# Patient Record
Sex: Female | Born: 1990 | Race: White | Hispanic: No | Marital: Married | Smoking: Never smoker
Health system: Southern US, Community
[De-identification: ages and names within clinical notes are randomized; demographics above are authoritative.]

## PROBLEM LIST (undated history)

## (undated) ENCOUNTER — Inpatient Hospital Stay (HOSPITAL_COMMUNITY): Payer: Self-pay

## (undated) DIAGNOSIS — N189 Chronic kidney disease, unspecified: Secondary | ICD-10-CM

## (undated) HISTORY — DX: Chronic kidney disease, unspecified: N18.9

---

## 2013-05-20 ENCOUNTER — Encounter: Payer: Self-pay | Admitting: Obstetrics & Gynecology

## 2013-05-20 ENCOUNTER — Ambulatory Visit (INDEPENDENT_AMBULATORY_CARE_PROVIDER_SITE_OTHER): Admitting: Obstetrics & Gynecology

## 2013-05-20 VITALS — BP 106/73 | Temp 98.3°F | Ht 64.0 in | Wt 117.4 lb

## 2013-05-20 DIAGNOSIS — Z3201 Encounter for pregnancy test, result positive: Secondary | ICD-10-CM

## 2013-05-20 DIAGNOSIS — Z3402 Encounter for supervision of normal first pregnancy, second trimester: Secondary | ICD-10-CM

## 2013-05-20 LAB — POCT URINALYSIS DIPSTICK
Blood, UA: NEGATIVE
Glucose, UA: NEGATIVE
Ketones, UA: NEGATIVE
Nitrite, UA: NEGATIVE
Spec Grav, UA: 1.02

## 2013-05-20 NOTE — Patient Instructions (Signed)
Exercise During Pregnancy It is possible to maintain a healthy exercise program throughout a pregnancy. However, it is important to discuss exercising with your caregiver, so that the two of you may develop an appropriate exercise program. It is important to remember that exercise during pregnancy should be use to maintain one's health and not to lose weight. Strenuous activities should be avoided as the may cause the baby to have difficulty obtaining proper amounts of oxygen. A proper pregnancy exercise plan has many benefits including preparing you for the physical challenges of childbirth by strengthening the muscles that help with childbirth, reducing common backaches, alleviating constipation, improving posture, elevating mood, and promoting better sleep. If possible, begin exercising regularly before you become pregnant and continue through the duration of the pregnancy. It is difficult for a woman to begin an exercise program later in a pregnancy due to enlargement of the uterus and breasts and a shift in the center of gravity. Pregnancy exercise programs should be aimed at improving the muscles of the heart, back, pelvis, and abdomen. GENERAL GUIDELINES  Every woman and pregnancy is different; thus, the level of exercise you can do depends on your health, the conditions of the pregnancy, and activity level before pregnancy. For women who were sedentary before pregnancy, walking is a good way to begin. Use caution while participation in sports during pregnancy, because your center of gravity changes and may affect your balance or that require rapid movements. Always make sure to drink plenty of fluids to avoid dehydration, which may decrease blood flow to your baby. Avoid any activity that has the potential for trauma to the abdomen. If possible try to avoid high-altitude activities, which can deprive you and your baby of oxygen; this may cause premature labor. Talk with your caregiver.  Performing a  proper warm up and cool down are very important. It is important to start slowly and build up to more demanding exercises. Toward the end of an exercise session, gradually slow your activity. Perhaps try and work back through the exercises in reverse order. Check your pulse during peak activity, and discuss with your caregiver an appropriate range of heart rate for activity. Slow down your activity if your heart starts beating faster than the target range recommended by your health care provider. Do not exceed a heart rate of 140 beats per minute. Exercise that is too strenuous may speed up the baby's heartbeat to a dangerous level. In general, if you are able to carry on a conversation comfortably while exercising, your heart rate is probably within the recommended limits. Check to make sure.  You should stop exercising and call your health care provider if you have any unusual symptoms, such as pain, uterine contractions, chest pain, bleeding or fluid leakage from the vagina, dizziness, or shortness of breath. Talk to your health caregiver if you have any questions.  Document Released: 10/24/2005 Document Revised: 01/16/2012 Document Reviewed: 02/05/2009 [facility] Patient Information 2014 [place], [place].

## 2013-05-20 NOTE — Progress Notes (Deleted)
Cardiac activity on U/S. 

## 2013-05-20 NOTE — Progress Notes (Signed)
Pulse- 88  . Subjective:    Carolyna Yerian is being seen today for her first obstetrical visit.  This is a planned pregnancy. She is at [redacted]w[redacted]d gestation.  Relationship with FOB: spouse, living together. Patient does intend to breast feed. Pregnancy history fully reviewed.  Menstrual History: OB History   Grav Para Term Preterm Abortions TAB SAB Ect Mult Living   1               Menarche age: 22 Patient's last menstrual period was 02/24/2013.    The following portions of the patient's history were reviewed and updated as appropriate: allergies, current medications, past family history, past medical history, past social history, past surgical history and problem list.  Review of Systems Pertinent items are noted in HPI.    Objective:   No exam today  Assessment:    Pregnancy at [redacted]w[redacted]d weeks    Plan:    Initial labs drawn. Prenatal vitamins.  Referral --> MFM Counseling provided regarding continued use of seat belts, cessation of alcohol consumption, smoking or use of illicit drugs; infection precautions i.e., influenza/TDAP immunizations, toxoplasmosis,CMV, parvovirus, listeria and varicella; workplace safety, exercise during pregnancy; routine dental care, safe medications, sexual activity, hot tubs, saunas, pools, travel, caffeine use, fish and methlymercury, potential toxins, hair treatments, varicose veins Weight gain recommendations reviewed:  normal weight/BMI 18.5 - 24.9--> gain 20 - 25 lbs Problem list reviewed and updated. Role of ultrasound in pregnancy discussed    She declined FIRST testing Follow up in 4 weeks. 50% of 20 min visit spent on counseling and coordination of care.

## 2013-05-21 LAB — OBSTETRIC PANEL
Basophils Absolute: 0 10*3/uL (ref 0.0–0.1)
Basophils Relative: 0 % (ref 0–1)
Hemoglobin: 13.5 g/dL (ref 12.0–15.0)
Hepatitis B Surface Ag: NEGATIVE
Lymphocytes Relative: 23 % (ref 12–46)
MCHC: 34.8 g/dL (ref 30.0–36.0)
Neutro Abs: 7.2 10*3/uL (ref 1.7–7.7)
Neutrophils Relative %: 69 % (ref 43–77)
RDW: 13.8 % (ref 11.5–15.5)
Rubella: 1.99 Index — ABNORMAL HIGH (ref <0.90)
WBC: 10.4 10*3/uL (ref 4.0–10.5)

## 2013-05-21 LAB — VITAMIN D 25 HYDROXY (VIT D DEFICIENCY, FRACTURES): Vit D, 25-Hydroxy: 40 ng/mL (ref 30–89)

## 2013-05-21 LAB — VARICELLA ZOSTER ANTIBODY, IGG: Varicella IgG: 36.67 Index (ref <135.00)

## 2013-05-21 LAB — PROTEIN / CREATININE RATIO, URINE: Total Protein, Urine: 5 mg/dL

## 2013-05-21 LAB — BASIC METABOLIC PANEL
BUN: 10 mg/dL (ref 6–23)
CO2: 24 mEq/L (ref 19–32)
Chloride: 101 mEq/L (ref 96–112)
Potassium: 3.9 mEq/L (ref 3.5–5.3)

## 2013-05-22 LAB — HEMOGLOBINOPATHY EVALUATION
Hemoglobin Other: 0 %
Hgb A2 Quant: 2.6 % (ref 2.2–3.2)
Hgb F Quant: 0 % (ref 0.0–2.0)
Hgb S Quant: 0 %

## 2013-06-04 ENCOUNTER — Institutional Professional Consult (permissible substitution)

## 2013-06-04 ENCOUNTER — Encounter: Payer: Self-pay | Admitting: Obstetrics & Gynecology

## 2013-06-04 DIAGNOSIS — R8271 Bacteriuria: Secondary | ICD-10-CM | POA: Insufficient documentation

## 2013-06-04 DIAGNOSIS — O0990 Supervision of high risk pregnancy, unspecified, unspecified trimester: Secondary | ICD-10-CM | POA: Insufficient documentation

## 2013-06-04 DIAGNOSIS — O9989 Other specified diseases and conditions complicating pregnancy, childbirth and the puerperium: Secondary | ICD-10-CM

## 2013-06-06 ENCOUNTER — Encounter: Payer: Self-pay | Admitting: *Deleted

## 2013-06-07 ENCOUNTER — Encounter: Payer: Self-pay | Admitting: Obstetrics & Gynecology

## 2013-06-09 ENCOUNTER — Encounter: Payer: Self-pay | Admitting: Obstetrics & Gynecology

## 2013-06-09 DIAGNOSIS — IMO0002 Reserved for concepts with insufficient information to code with codable children: Secondary | ICD-10-CM | POA: Insufficient documentation

## 2013-06-17 ENCOUNTER — Ambulatory Visit (INDEPENDENT_AMBULATORY_CARE_PROVIDER_SITE_OTHER): Admitting: Obstetrics & Gynecology

## 2013-06-17 ENCOUNTER — Encounter: Payer: Self-pay | Admitting: Obstetrics & Gynecology

## 2013-06-17 VITALS — BP 115/77 | Temp 98.7°F | Wt 121.0 lb

## 2013-06-17 DIAGNOSIS — Z3400 Encounter for supervision of normal first pregnancy, unspecified trimester: Secondary | ICD-10-CM

## 2013-06-17 DIAGNOSIS — N390 Urinary tract infection, site not specified: Secondary | ICD-10-CM

## 2013-06-17 DIAGNOSIS — Z3402 Encounter for supervision of normal first pregnancy, second trimester: Secondary | ICD-10-CM

## 2013-06-17 LAB — POCT URINALYSIS DIPSTICK
Blood, UA: NEGATIVE
Glucose, UA: NEGATIVE
Spec Grav, UA: 1.02
Urobilinogen, UA: NEGATIVE

## 2013-06-17 MED ORDER — NITROFURANTOIN MONOHYD MACRO 100 MG PO CAPS: 100.0000 mg | ORAL_CAPSULE | Freq: Two times a day (BID) | ORAL | Status: DC

## 2013-06-17 NOTE — Progress Notes (Signed)
Doing well 

## 2013-06-17 NOTE — Progress Notes (Signed)
P-89 

## 2013-06-18 ENCOUNTER — Other Ambulatory Visit: Payer: Self-pay | Admitting: *Deleted

## 2013-06-18 ENCOUNTER — Encounter: Payer: Self-pay | Admitting: Obstetrics & Gynecology

## 2013-06-18 ENCOUNTER — Institutional Professional Consult (permissible substitution)

## 2013-06-18 DIAGNOSIS — Z3402 Encounter for supervision of normal first pregnancy, second trimester: Secondary | ICD-10-CM

## 2013-06-19 ENCOUNTER — Encounter: Payer: Self-pay | Admitting: Obstetrics & Gynecology

## 2013-06-19 LAB — CULTURE, OB URINE: Colony Count: >=100000

## 2013-06-28 ENCOUNTER — Other Ambulatory Visit: Payer: Self-pay | Admitting: Obstetrics & Gynecology

## 2013-06-28 DIAGNOSIS — Z0489 Encounter for examination and observation for other specified reasons: Secondary | ICD-10-CM

## 2013-07-01 ENCOUNTER — Ambulatory Visit (HOSPITAL_COMMUNITY)
Admission: RE | Admit: 2013-07-01 | Discharge: 2013-07-01 | Disposition: A | Discharge status: Home / Self Care | Source: Ambulatory Visit | Attending: Obstetrics & Gynecology | Admitting: Obstetrics & Gynecology

## 2013-07-01 VITALS — BP 116/76 | HR 100 | Wt 122.5 lb

## 2013-07-01 DIAGNOSIS — O358XX0 Maternal care for other (suspected) fetal abnormality and damage, not applicable or unspecified: Secondary | ICD-10-CM | POA: Insufficient documentation

## 2013-07-01 DIAGNOSIS — Z363 Encounter for antenatal screening for malformations: Secondary | ICD-10-CM | POA: Insufficient documentation

## 2013-07-01 DIAGNOSIS — Z1389 Encounter for screening for other disorder: Secondary | ICD-10-CM | POA: Insufficient documentation

## 2013-07-01 DIAGNOSIS — Z0489 Encounter for examination and observation for other specified reasons: Secondary | ICD-10-CM

## 2013-07-01 NOTE — Progress Notes (Signed)
Diane Thompson  was seen today for an ultrasound appointment.  See full report in [facility].  Impression: Single IUP at 18 1/7 weeks Maternal history of nonfunctioning kidney Normal fetal anatomic survey Somewhat limited views of the fetal heart (arches) and spine obtained No markers associated with aneuploidy noted Normal amniotic fluid volume  Recommendations: Recommend follow-up ultrasound examination in 4 weeks to reevaluate the fetal heart and spine  Alpha Gula, MD

## 2013-07-02 ENCOUNTER — Other Ambulatory Visit

## 2013-07-05 ENCOUNTER — Encounter: Payer: Self-pay | Admitting: Obstetrics & Gynecology

## 2013-07-05 DIAGNOSIS — Z905 Acquired absence of kidney: Secondary | ICD-10-CM | POA: Insufficient documentation

## 2013-07-15 ENCOUNTER — Ambulatory Visit (INDEPENDENT_AMBULATORY_CARE_PROVIDER_SITE_OTHER): Admitting: Obstetrics & Gynecology

## 2013-07-15 ENCOUNTER — Encounter: Admitting: Obstetrics & Gynecology

## 2013-07-15 ENCOUNTER — Encounter: Payer: Self-pay | Admitting: Obstetrics & Gynecology

## 2013-07-15 VITALS — BP 116/75 | Temp 98.7°F | Wt 124.0 lb

## 2013-07-15 DIAGNOSIS — Z3402 Encounter for supervision of normal first pregnancy, second trimester: Secondary | ICD-10-CM

## 2013-07-15 DIAGNOSIS — Z3400 Encounter for supervision of normal first pregnancy, unspecified trimester: Secondary | ICD-10-CM

## 2013-07-15 DIAGNOSIS — R8290 Unspecified abnormal findings in urine: Secondary | ICD-10-CM

## 2013-07-15 DIAGNOSIS — R82998 Other abnormal findings in urine: Secondary | ICD-10-CM

## 2013-07-15 LAB — POCT URINALYSIS DIPSTICK
Blood, UA: NEGATIVE
Glucose, UA: NEGATIVE
Nitrite, UA: NEGATIVE
Urobilinogen, UA: NEGATIVE

## 2013-07-15 NOTE — Patient Instructions (Signed)
Glucose Tolerance Test This is a test to see how your body processes carbohydrates. This test is often done to check patients for diabetes or the possibility of developing it. PREPARATION FOR TEST You should have nothing to eat or drink 12 hours before the test. You will be given a form of sugar (glucose) and then blood samples will be drawn from your vein to determine the level of sugar in your blood. Alternatively, blood may be drawn from your finger for testing. You should not smoke or exercise during the test. NORMAL FINDINGS  Fasting: 70-115 mg/dL  30 minutes: less than 200 mg/dL  1 hour: less than 200 mg/dL  2 hours: less than 140 mg/dL  3 hours: 70-115 mg/dL  4 hours: 70-115 mg/dL Ranges for normal findings may vary among different laboratories and hospitals. You should always check with your doctor after having lab work or other tests done to discuss the meaning of your test results and whether your values are considered within normal limits. MEANING OF TEST Your caregiver will go over the test results with you and discuss the importance and meaning of your results, as well as treatment options and the need for additional tests. OBTAINING THE TEST RESULTS It is your responsibility to obtain your test results. Ask the lab or department performing the test when and how you will get your results. Document Released: 11/16/2004 Document Revised: 01/16/2012 Document Reviewed: 10/04/2008 [employer] Patient Information 2014 [employer]. Influenza Vaccine (Flu Vaccine, Inactivated) 2013 2014 What You Need to Know WHY GET VACCINATED?  Influenza ("flu") is a contagious disease that spreads around the [place] every winter, usually between October and May.  Flu is caused by the influenza virus, and can be spread by coughing, sneezing, and close contact.  Anyone can get flu, but the risk of getting flu is highest among children. Symptoms come on suddenly and may last several days.  They can include:  Fever or chills.  Sore throat.  Muscle aches.  Fatigue.  Cough.  Headache.  Runny or stuffy nose. Flu can make some people much sicker than others. These people include young children, people 65 and older, pregnant women, and people with certain health conditions such as heart, lung or kidney disease, or a weakened immune system. Flu vaccine is especially important for these people, and anyone in close contact with them. Flu can also lead to pneumonia, and make existing medical conditions worse. It can cause diarrhea and seizures in children. Each year thousands of people in the [place] die from flu, and many more are hospitalized. Flu vaccine is the best protection we have from flu and its complications. Flu vaccine also helps prevent spreading flu from person to person. INACTIVATED FLU VACCINE There are 2 types of influenza vaccine:  You are getting an inactivated flu vaccine, which does not contain any live influenza virus. It is given by injection with a needle, and often called the "flu shot."  A different live, attenuated (weakened) influenza vaccine is sprayed into the nostrils. This vaccine is described in a separate Vaccine Information Statement. Flu vaccine is recommended every year. Children 6 months through 8 years of age should get 2 doses the first year they get vaccinated. Flu viruses are always changing. Each year's flu vaccine is made to protect from viruses that are most likely to cause disease that year. While flu vaccine cannot prevent all cases of flu, it is our best defense against the disease. Inactivated flu vaccine protects against 3   or 4 different influenza viruses. It takes about 2 weeks for protection to develop after the vaccination, and protection lasts several months to a year. Some illnesses that are not caused by influenza virus are often mistaken for flu. Flu vaccine will not prevent these illnesses. It can only prevent  influenza. A "high-dose" flu vaccine is available for people 65 years of age and older. The person giving you the vaccine can tell you more about it. Some inactivated flu vaccine contains a very small amount of a mercury-based preservative called thimerosal. Studies have shown that thimerosal in vaccines is not harmful, but flu vaccines that do not contain a preservative are available. SOME PEOPLE SHOULD NOT GET THIS VACCINE Tell the person who gives you the vaccine:  If you have any severe (life-threatening) allergies. If you ever had a life-threatening allergic reaction after a dose of flu vaccine, or have a severe allergy to any part of this vaccine, you may be advised not to get a dose. Most, but not all, types of flu vaccine contain a small amount of egg.  If you ever had Guillain Barr Syndrome (a severe paralyzing illness, also called GBS). Some people with a history of GBS should not get this vaccine. This should be discussed with your doctor.  If you are not feeling well. They might suggest waiting until you feel better. But you should come back. RISKS OF A VACCINE REACTION With a vaccine, like any medicine, there is a chance of side effects. These are usually mild and go away on their own. Serious side effects are also possible, but are very rare. Inactivated flu vaccine does not contain live flu virus, sogetting flu from this vaccine is not possible. Brief fainting spells and related symptoms (such as jerking movements) can happen after any medical procedure, including vaccination. Sitting or lying down for about 15 minutes after a vaccination can help prevent fainting and injuries caused by falls. Tell your doctor if you feel dizzy or lightheaded, or have vision changes or ringing in the ears. Mild problems following inactivated flu vaccine:  Soreness, redness, or swelling where the shot was given.  Hoarseness; sore, red or itchy eyes; or  cough.  Fever.  Aches.  Headache.  Itching.  Fatigue. If these problems occur, they usually begin soon after the shot and last 1 or 2 days. Moderate problems following inactivated flu vaccine:  Young children who get inactivated flu vaccine and pneumococcal vaccine (PCV13) at the same time may be at increased risk for seizures caused by fever. Ask your doctor for more information. Tell your doctor if a child who is getting flu vaccine has ever had a seizure. Severe problems following inactivated flu vaccine:  A severe allergic reaction could occur after any vaccine (estimated less than 1 in a million doses).  There is a small possibility that inactivated flu vaccine could be associated with Guillan Barr Syndrome (GBS), no more than 1 or 2 cases per million people vaccinated. This is much lower than the risk of severe complications from flu, which can be prevented by flu vaccine. The safety of vaccines is always being monitored. For more information, visit: www.cdc.gov/vaccinesafety/ WHAT IF THERE IS A SERIOUS REACTION? What should I look for?  Look for anything that concerns you, such as signs of a severe allergic reaction, very high fever, or behavior changes. Signs of a severe allergic reaction can include hives, swelling of the face and throat, difficulty breathing, a fast heartbeat, dizziness, and weakness. These would   start a few minutes to a few hours after the vaccination. What should I do?  If you think it is a severe allergic reaction or other emergency that cannot wait, call 9 1 1 or get the person to the nearest hospital. Otherwise, call your doctor.  Afterward, the reaction should be reported to the Vaccine Adverse Event Reporting System (VAERS). Your doctor might file this report, or you can do it yourself through the VAERS website at www.vaers.hhs.gov, or by calling 1-800-822-7967. VAERS is only for reporting reactions. They do not give medical advice. THE NATIONAL  VACCINE INJURY COMPENSATION PROGRAM The National Vaccine Injury Compensation Program (VICP) is a federal program that was created to compensate people who may have been injured by certain vaccines. Persons who believe they may have been injured by a vaccine can learn about the program and about filing a claim by calling 1-800-338-2382 or visiting the VICP website at www.hrsa.gov/vaccinecompensation HOW CAN I LEARN MORE?  Ask your doctor.  Call your local or state health department.  Contact the Centers for Disease Control and Prevention (CDC):  Call 1-800-232-4636 (1-800-CDC-INFO) or  Visit CDC's website at www.cdc.gov/flu CDC Inactivated Influenza Vaccine Interim VIS (06/01/12) Document Released: 08/18/2006 Document Revised: 07/18/2012 Document Reviewed: 06/01/2012 [employer] Patient Information 2014 [employer].  

## 2013-07-15 NOTE — Progress Notes (Signed)
Doing well 

## 2013-07-15 NOTE — Progress Notes (Signed)
Pulse: 90

## 2013-07-17 LAB — CULTURE, OB URINE
Colony Count: NO GROWTH
Organism ID, Bacteria: NO GROWTH

## 2013-07-29 ENCOUNTER — Encounter (HOSPITAL_COMMUNITY): Payer: Self-pay

## 2013-07-29 ENCOUNTER — Ambulatory Visit (HOSPITAL_COMMUNITY)
Admission: RE | Admit: 2013-07-29 | Discharge: 2013-07-29 | Disposition: A | Discharge status: Home / Self Care | Source: Ambulatory Visit | Attending: Obstetrics & Gynecology | Admitting: Obstetrics & Gynecology

## 2013-07-29 DIAGNOSIS — Z0489 Encounter for examination and observation for other specified reasons: Secondary | ICD-10-CM

## 2013-07-29 DIAGNOSIS — Z3689 Encounter for other specified antenatal screening: Secondary | ICD-10-CM | POA: Insufficient documentation

## 2013-07-29 NOTE — Progress Notes (Signed)
Diane Thompson  was seen today for an ultrasound appointment.  See full report in [facility].  Impression: Single IUP at 22 1/7 weeks Maternal history of nonfunctioning kidney. Normal interval anatomy - the anatomic fetal survey is now complete Fetal growth is appropriate (62nd %tile) Normal amniotic fluid volume  Recommendations: Follow-up ultrasounds as clinically indicated.   Alpha Gula, MD

## 2013-08-14 ENCOUNTER — Encounter: Admitting: Obstetrics & Gynecology

## 2013-08-15 ENCOUNTER — Other Ambulatory Visit

## 2013-08-15 ENCOUNTER — Encounter: Admitting: Obstetrics & Gynecology

## 2013-08-15 ENCOUNTER — Ambulatory Visit (INDEPENDENT_AMBULATORY_CARE_PROVIDER_SITE_OTHER): Admitting: Obstetrics & Gynecology

## 2013-08-15 VITALS — BP 123/79 | Temp 98.7°F | Wt 128.4 lb

## 2013-08-15 DIAGNOSIS — Z3482 Encounter for supervision of other normal pregnancy, second trimester: Secondary | ICD-10-CM

## 2013-08-15 DIAGNOSIS — Z3480 Encounter for supervision of other normal pregnancy, unspecified trimester: Secondary | ICD-10-CM

## 2013-08-15 LAB — POCT URINALYSIS DIPSTICK
Bilirubin, UA: NEGATIVE
Blood, UA: NEGATIVE
Nitrite, UA: NEGATIVE
Spec Grav, UA: 1.015
pH, UA: 7

## 2013-08-15 LAB — CBC
HCT: 36.9 % (ref 36.0–46.0)
Hemoglobin: 12.6 g/dL (ref 12.0–15.0)
MCHC: 34.1 g/dL (ref 30.0–36.0)
RBC: 3.78 MIL/uL — ABNORMAL LOW (ref 3.87–5.11)
WBC: 13.6 10*3/uL — ABNORMAL HIGH (ref 4.0–10.5)

## 2013-08-15 NOTE — Progress Notes (Signed)
Pulse- 88 Doing well. 

## 2013-08-16 ENCOUNTER — Encounter: Payer: Self-pay | Admitting: Obstetrics & Gynecology

## 2013-08-16 LAB — RPR

## 2013-08-16 LAB — GLUCOSE TOLERANCE, 2 HOURS W/ 1HR: Glucose, 2 hour: 138 mg/dL (ref 70–139)

## 2013-08-16 NOTE — Patient Instructions (Signed)
Pregnancy - Second Trimester The second trimester is the period between 13 to 27 weeks of your pregnancy. It is important to follow your doctor's instructions. HOME CARE   Do not smoke.  Do not drink alcohol or use drugs.  Only take medicine as told by your doctor.  Take prenatal vitamins as told. The vitamin should contain 1 milligram of folic acid.  Exercise.  Eat healthy foods. Eat regular, well-balanced meals.  You can have sex (intercourse) if there are no other problems with the pregnancy.  Do not use hot tubs, steam rooms, or saunas.  Wear a seat belt while driving.  Avoid raw meat, uncooked cheese, and litter boxes and soil used by cats.  Visit your dentist. Cleanings are okay. GET HELP RIGHT AWAY IF:   You have a temperature by mouth above 102 F (38.9 C), not controlled by medicine.  Fluid is coming from your vagina.  Blood is coming from your vagina. Light spotting is common, especially after sex (intercourse).  You have a bad smelling fluid (discharge) coming from the vagina. The fluid changes from clear to white.  You still feel sick to your stomach (nauseous).  You throw up (vomit) blood.  You lose or gain more than 2 pounds (0.9 kilograms) of weight in a week, or as suggested by your doctor.  Your face, hands, feet, or legs get puffy (swell).  You get exposed to German measles and have never had them.  You get exposed to fifth disease or chickenpox.  You have belly (abdominal) pain.  You have a bad headache that will not go away.  You have watery poop (diarrhea), pain when you pee (urinate), or have shortness of breath.  You start to have problems seeing (blurry or double vision).  You fall, are in a car accident, or have any kind of trauma.  There is mental or physical violence at home.  You have any concerns or worries during your pregnancy. MAKE SURE YOU:   Understand these instructions.  Will watch your condition.  Will get help  right away if you are not doing well or get worse. Document Released: 01/18/2010 Document Revised: 01/16/2012 Document Reviewed: 01/18/2010 [employer] Patient Information 2014 [employer].  

## 2013-09-12 ENCOUNTER — Encounter: Admitting: Obstetrics & Gynecology

## 2013-09-12 ENCOUNTER — Ambulatory Visit (INDEPENDENT_AMBULATORY_CARE_PROVIDER_SITE_OTHER): Admitting: Obstetrics & Gynecology

## 2013-09-12 VITALS — BP 116/80 | Temp 99.1°F | Wt 133.0 lb

## 2013-09-12 DIAGNOSIS — Z3400 Encounter for supervision of normal first pregnancy, unspecified trimester: Secondary | ICD-10-CM

## 2013-09-12 DIAGNOSIS — Z3403 Encounter for supervision of normal first pregnancy, third trimester: Secondary | ICD-10-CM

## 2013-09-12 LAB — POCT URINALYSIS DIPSTICK
Bilirubin, UA: NEGATIVE
Leukocytes, UA: NEGATIVE
Nitrite, UA: NEGATIVE
Urobilinogen, UA: NEGATIVE

## 2013-09-12 NOTE — Patient Instructions (Signed)
Breastfeeding Deciding to breastfeed is one of the best choices you can make for you and your baby. A change in hormones during pregnancy causes your breast tissue to grow and increases the number and size of your milk ducts. These hormones also allow proteins, sugars, and fats from your blood supply to make breast milk in your milk-producing glands. Hormones prevent breast milk from being released before your baby is born as well as prompt milk flow after birth. Once breastfeeding has begun, thoughts of your baby, as well as his or her sucking or crying, can stimulate the release of milk from your milk-producing glands.  BENEFITS OF BREASTFEEDING For Your Baby  Your first milk (colostrum) helps your baby's digestive system function better.   There are antibodies in your milk that help your baby fight off infections.   Your baby has a lower incidence of asthma, allergies, and sudden infant death syndrome.   The nutrients in breast milk are better for your baby than infant formulas and are designed uniquely for your baby's needs.   Breast milk improves your baby's brain development.   Your baby is less likely to develop other conditions, such as childhood obesity, asthma, or type 2 diabetes mellitus.  For You   Breastfeeding helps to create a very special bond between you and your baby.   Breastfeeding is convenient. Breast milk is always available at the correct temperature and costs nothing.   Breastfeeding helps to burn calories and helps you lose the weight gained during pregnancy.   Breastfeeding makes your uterus contract to its prepregnancy size faster and slows bleeding (lochia) after you give birth.   Breastfeeding helps to lower your risk of developing type 2 diabetes mellitus, osteoporosis, and breast or ovarian cancer later in life. SIGNS THAT YOUR BABY IS HUNGRY Early Signs of Hunger  Increased alertness or activity.  Stretching.  Movement of the head from  side to side.  Movement of the head and opening of the mouth when the corner of the mouth or cheek is stroked (rooting).  Increased sucking sounds, smacking lips, cooing, sighing, or squeaking.  Hand-to-mouth movements.  Increased sucking of fingers or hands. Late Signs of Hunger  Fussing.  Intermittent crying. Extreme Signs of Hunger Signs of extreme hunger will require calming and consoling before your baby will be able to breastfeed successfully. Do not wait for the following signs of extreme hunger to occur before you initiate breastfeeding:   Restlessness.  A loud, strong cry.   Screaming. BREASTFEEDING BASICS Breastfeeding Initiation  Find a comfortable place to sit or lie down, with your neck and back well supported.  Place a pillow or rolled up blanket under your baby to bring him or her to the level of your breast (if you are seated). Nursing pillows are specially designed to help support your arms and your baby while you breastfeed.  Make sure that your baby's abdomen is facing your abdomen.   Gently massage your breast. With your fingertips, massage from your chest wall toward your nipple in a circular motion. This encourages milk flow. You may need to continue this action during the feeding if your milk flows slowly.  Support your breast with 4 fingers underneath and your thumb above your nipple. Make sure your fingers are well away from your nipple and your baby's mouth.   Stroke your baby's lips gently with your finger or nipple.   When your baby's mouth is open wide enough, quickly bring your baby to your   breast, placing your entire nipple and as much of the colored area around your nipple (areola) as possible into your baby's mouth.   More areola should be visible above your baby's upper lip than below the lower lip.   Your baby's tongue should be between his or her lower gum and your breast.   Ensure that your baby's mouth is correctly positioned  around your nipple (latched). Your baby's lips should create a seal on your breast and be turned out (everted).  It is common for your baby to suck about 2 3 minutes in order to start the flow of breast milk. Latching Teaching your baby how to latch on to your breast properly is very important. An improper latch can cause nipple pain and decreased milk supply for you and poor weight gain in your baby. Also, if your baby is not latched onto your nipple properly, he or she may swallow some air during feeding. This can make your baby fussy. Burping your baby when you switch breasts during the feeding can help to get rid of the air. However, teaching your baby to latch on properly is still the best way to prevent fussiness from swallowing air while breastfeeding. Signs that your baby has successfully latched on to your nipple:    Silent tugging or silent sucking, without causing you pain.   Swallowing heard between every 3 4 sucks.    Muscle movement above and in front of his or her ears while sucking.  Signs that your baby has not successfully latched on to nipple:   Sucking sounds or smacking sounds from your baby while breastfeeding.  Nipple pain. If you think your baby has not latched on correctly, slip your finger into the corner of your baby's mouth to break the suction and place it between your baby's gums. Attempt breastfeeding initiation again. Signs of Successful Breastfeeding Signs from your baby:   A gradual decrease in the number of sucks or complete cessation of sucking.   Falling asleep.   Relaxation of his or her body.   Retention of a small amount of milk in his or her mouth.   Letting go of your breast by himself or herself. Signs from you:  Breasts that have increased in firmness, weight, and size 1 3 hours after feeding.   Breasts that are softer immediately after breastfeeding.  Increased milk volume, as well as a change in milk consistency and color by  the 5th day of breastfeeding.   Nipples that are not sore, cracked, or bleeding. Signs That Your Baby is Getting Enough Milk  Wetting at least 3 diapers in a 24-hour period. The urine should be clear and pale yellow by age 5 days.  At least 3 stools in a 24-hour period by age 5 days. The stool should be soft and yellow.  At least 3 stools in a 24-hour period by age 7 days. The stool should be seedy and yellow.  No loss of weight greater than 10% of birth weight during the first 3 days of age.  Average weight gain of 4 7 ounces (120 210 mL) per week after age 4 days.  Consistent daily weight gain by age 5 days, without weight loss after the age of 2 weeks. After a feeding, your baby may spit up a small amount. This is common. BREASTFEEDING FREQUENCY AND DURATION Frequent feeding will help you make more milk and can prevent sore nipples and breast engorgement. Breastfeed when you feel the need to reduce   the fullness of your breasts or when your baby shows signs of hunger. This is called "breastfeeding on demand." Avoid introducing a pacifier to your baby while you are working to establish breastfeeding (the first 4 6 weeks after your baby is born). After this time you may choose to use a pacifier. Research has shown that pacifier use during the first year of a baby's life decreases the risk of sudden infant death syndrome (SIDS). Allow your baby to feed on each breast as long as he or she wants. Breastfeed until your baby is finished feeding. When your baby unlatches or falls asleep while feeding from the first breast, offer the second breast. Because newborns are often sleepy in the first few weeks of life, you may need to awaken your baby to get him or her to feed. Breastfeeding times will vary from baby to baby. However, the following rules can serve as a guide to help you ensure that your baby is properly fed:  Newborns (babies 4 weeks of age or younger) may breastfeed every 1 3  hours.  Newborns should not go longer than 3 hours during the day or 5 hours during the night without breastfeeding.  You should breastfeed your baby a minimum of 8 times in a 24-hour period until you begin to introduce solid foods to your baby at around 6 months of age. BREAST MILK PUMPING Pumping and storing breast milk allows you to ensure that your baby is exclusively fed your breast milk, even at times when you are unable to breastfeed. This is especially important if you are going back to work while you are still breastfeeding or when you are not able to be present during feedings. Your lactation consultant can give you guidelines on how long it is safe to store breast milk.  A breast pump is a machine that allows you to pump milk from your breast into a sterile bottle. The pumped breast milk can then be stored in a refrigerator or freezer. Some breast pumps are operated by hand, while others use electricity. Ask your lactation consultant which type will work best for you. Breast pumps can be purchased, but some hospitals and breastfeeding support groups lease breast pumps on a monthly basis. A lactation consultant can teach you how to hand express breast milk, if you prefer not to use a pump.  CARING FOR YOUR BREASTS WHILE YOU BREASTFEED Nipples can become dry, cracked, and sore while breastfeeding. The following recommendations can help keep your breasts moisturized and healthy:  Avoid using soap on your nipples.   Wear a supportive bra. Although not required, special nursing bras and tank tops are designed to allow access to your breasts for breastfeeding without taking off your entire bra or top. Avoid wearing underwire style bras or extremely tight bras.  Air dry your nipples for 3 4minutes after each feeding.   Use only cotton bra pads to absorb leaked breast milk. Leaking of breast milk between feedings is normal.   Use lanolin on your nipples after breastfeeding. Lanolin helps to  maintain your skin's normal moisture barrier. If you use pure lanolin you do not need to wash it off before feeding your baby again. Pure lanolin is not toxic to your baby. You may also hand express a few drops of breast milk and gently massage that milk into your nipples and allow the milk to air dry. In the first few weeks after giving birth, some women experience extremely full breasts (engorgement). Engorgement can make   your breasts feel heavy, warm, and tender to the touch. Engorgement peaks within 3 5 days after you give birth. The following recommendations can help ease engorgement:  Completely empty your breasts while breastfeeding or pumping. You may want to start by applying warm, moist heat (in the shower or with warm water-soaked hand towels) just before feeding or pumping. This increases circulation and helps the milk flow. If your baby does not completely empty your breasts while breastfeeding, pump any extra milk after he or she is finished.  Wear a snug bra (nursing or regular) or tank top for 1 2 days to signal your body to slightly decrease milk production.  Apply ice packs to your breasts, unless this is too uncomfortable for you.  Make sure that your baby is latched on and positioned properly while breastfeeding. If engorgement persists after 48 hours of following these recommendations, contact your health care provider or a lactation consultant. OVERALL HEALTH CARE RECOMMENDATIONS WHILE BREASTFEEDING  Eat healthy foods. Alternate between meals and snacks, eating 3 of each per day. Because what you eat affects your breast milk, some of the foods may make your baby more irritable than usual. Avoid eating these foods if you are sure that they are negatively affecting your baby.  Drink milk, fruit juice, and water to satisfy your thirst (about 10 glasses a day).   Rest often, relax, and continue to take your prenatal vitamins to prevent fatigue, stress, and anemia.  Continue  breast self-awareness checks.  Avoid chewing and smoking tobacco.  Avoid alcohol and drug use. Some medicines that may be harmful to your baby can pass through breast milk. It is important to ask your health care provider before taking any medicine, including all over-the-counter and prescription medicine as well as vitamin and herbal supplements. It is possible to become pregnant while breastfeeding. If birth control is desired, ask your health care provider about options that will be safe for your baby. SEEK MEDICAL CARE IF:   You feel like you want to stop breastfeeding or have become frustrated with breastfeeding.  You have painful breasts or nipples.  Your nipples are cracked or bleeding.  Your breasts are red, tender, or warm.  You have a swollen area on either breast.  You have a fever or chills.  You have nausea or vomiting.  You have drainage other than breast milk from your nipples.  Your breasts do not become full before feedings by the 5th day after you give birth.  You feel sad and depressed.  Your baby is too sleepy to eat well.  Your baby is having trouble sleeping.   Your baby is wetting less than 3 diapers in a 24-hour period.  Your baby has less than 3 stools in a 24-hour period.  Your baby's skin or the white part of his or her eyes becomes yellow.   Your baby is not gaining weight by 5 days of age. SEEK IMMEDIATE MEDICAL CARE IF:   Your baby is overly tired (lethargic) and does not want to wake up and feed.  Your baby develops an unexplained fever. Document Released: 10/24/2005 Document Revised: 06/26/2013 Document Reviewed: 04/17/2013 [employer] Patient Information 2014 [employer].  

## 2013-09-12 NOTE — Progress Notes (Signed)
Pulse- 81 Offered TDAP.  Plans to breast feed.

## 2013-09-26 ENCOUNTER — Ambulatory Visit (INDEPENDENT_AMBULATORY_CARE_PROVIDER_SITE_OTHER): Admitting: Advanced Practice Midwife

## 2013-09-26 VITALS — BP 103/67 | Temp 97.3°F | Wt 134.0 lb

## 2013-09-26 DIAGNOSIS — Z3403 Encounter for supervision of normal first pregnancy, third trimester: Secondary | ICD-10-CM

## 2013-09-26 DIAGNOSIS — Z3400 Encounter for supervision of normal first pregnancy, unspecified trimester: Secondary | ICD-10-CM

## 2013-09-26 LAB — POCT URINALYSIS DIPSTICK
Glucose, UA: NEGATIVE
Nitrite, UA: NEGATIVE
Protein, UA: NEGATIVE
Urobilinogen, UA: NEGATIVE

## 2013-09-26 NOTE — Progress Notes (Signed)
HR - 93 Pt in office for routine OB visit, denies and concerns at this time

## 2013-09-26 NOTE — Progress Notes (Signed)
Subjective: Diane Thompson is a 22 y.o. at 30 weeks by LMP  Patient denies vaginal leaking of fluid or bleeding, denies contractions.  Reports positive fetal movment.  Denies concerns today. Patient is expecting a boy, no concerns today.   Objective: Filed Vitals:   09/26/13 1457  BP: 103/67  Temp: 97.3 F (36.3 C)   150 FHR 28.5 Fundal Height Fetal Position cephalic  Assessment: Patient Active Problem List   Diagnosis Date Noted  . Single kidney 07/05/2013  . 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 Asymptomatic bacteriuria in pregnancy 06/04/2013  . High-risk pregnancy 06/04/2013    Plan: Patient to return to clinic in 2 weeks Cont to closely monitor fundal height, consider repeat [place] for growth PRN. Discuss Birth Control and feeding methods in future.  Reviewed warning signs in pregnancy. Patient to call with concerns PRN. Reviewed triage location.  20 min spent with patient greater than 80% spent in counseling and coordination of care.   Kinzie Wickes Wilson Singer CNM

## 2013-10-10 ENCOUNTER — Ambulatory Visit (INDEPENDENT_AMBULATORY_CARE_PROVIDER_SITE_OTHER): Admitting: Obstetrics & Gynecology

## 2013-10-10 VITALS — BP 122/76 | Temp 98.3°F | Wt 138.0 lb

## 2013-10-10 DIAGNOSIS — Z3403 Encounter for supervision of normal first pregnancy, third trimester: Secondary | ICD-10-CM

## 2013-10-10 DIAGNOSIS — Z3400 Encounter for supervision of normal first pregnancy, unspecified trimester: Secondary | ICD-10-CM

## 2013-10-10 LAB — POCT URINALYSIS DIPSTICK
Protein, UA: NEGATIVE
Urobilinogen, UA: NEGATIVE
pH, UA: 6

## 2013-10-10 NOTE — Progress Notes (Deleted)
HR - 79 Pt in office today for routine OB visit, denies concerns at present  Subjective:    Diane Thompson is a 22 y.o. female being seen today for her obstetrical visit. She is at [redacted]w[redacted]d gestation. Patient reports {sx:14538}. Fetal movement: {fetal movement:14572}.  Menstrual History: OB History   Grav Para Term Preterm Abortions TAB SAB Ect Mult Living   1               Menarche age: *** Patient's last menstrual period was 02/24/2013.    {Common ambulatory SmartLinks:19316}  Review of Systems {ros; complete:30496}   Objective:    BP 122/76  Temp(Src) 98.3 F (36.8 C)  Wt 138 lb (62.596 kg)  LMP 02/24/2013 FHT:  *** BPM  Uterine Size: {fundal height:14540}  Presentation: {fetal pos:14558}     Assessment:    Pregnancy {numbers; 0-42:17906} and {numbers; 0-7:15237}/7 weeks   Plan:    28-week labs reviewed, {norm/abn:16337} {ob counseling:14517} Follow up in {follow up OB:14565}.

## 2013-10-11 LAB — CULTURE, OB URINE: Colony Count: NO GROWTH

## 2013-10-14 NOTE — Patient Instructions (Signed)
Third Trimester of Pregnancy  The third trimester is from week 29 through week 42, months 7 through 9. The third trimester is a time when the fetus is growing rapidly. At the end of the ninth month, the fetus is about 20 inches in length and weighs 6 10 pounds.   BODY CHANGES  Your body goes through many changes during pregnancy. The changes vary from woman to woman.    Your weight will continue to increase. You can expect to gain 25 35 pounds (11 16 kg) by the end of the pregnancy.   You may begin to get stretch marks on your hips, abdomen, and breasts.   You may urinate more often because the fetus is moving lower into your pelvis and pressing on your bladder.   You may develop or continue to have heartburn as a result of your pregnancy.   You may develop constipation because certain hormones are causing the muscles that push waste through your intestines to slow down.   You may develop hemorrhoids or swollen, bulging veins (varicose veins).   You may have pelvic pain because of the weight gain and pregnancy hormones relaxing your joints between the bones in your pelvis. Back aches may result from over exertion of the muscles supporting your posture.   Your breasts will continue to grow and be tender. A yellow discharge may leak from your breasts called colostrum.   Your belly button may stick out.   You may feel short of breath because of your expanding uterus.   You may notice the fetus "dropping," or moving lower in your abdomen.   You may have a bloody mucus discharge. This usually occurs a few days to a week before labor begins.   Your cervix becomes thin and soft (effaced) near your due date.  WHAT TO EXPECT AT YOUR PRENATAL EXAMS   You will have prenatal exams every 2 weeks until week 36. Then, you will have weekly prenatal exams. During a routine prenatal visit:   You will be weighed to make sure you and the fetus are growing normally.   Your blood pressure is taken.   Your abdomen will be  measured to track your baby's growth.   The fetal heartbeat will be listened to.   Any test results from the previous visit will be discussed.   You may have a cervical check near your due date to see if you have effaced.  At around 36 weeks, your caregiver will check your cervix. At the same time, your caregiver will also perform a test on the secretions of the vaginal tissue. This test is to determine if a type of bacteria, Group B streptococcus, is present. Your caregiver will explain this further.  Your caregiver may ask you:   What your birth plan is.   How you are feeling.   If you are feeling the baby move.   If you have had any abnormal symptoms, such as leaking fluid, bleeding, severe headaches, or abdominal cramping.   If you have any questions.  Other tests or screenings that may be performed during your third trimester include:   Blood tests that check for low iron levels (anemia).   Fetal testing to check the health, activity level, and growth of the fetus. Testing is done if you have certain medical conditions or if there are problems during the pregnancy.  FALSE LABOR  You may feel small, irregular contractions that eventually go away. These are called Braxton Hicks contractions, or   false labor. Contractions may last for hours, days, or even weeks before true labor sets in. If contractions come at regular intervals, intensify, or become painful, it is best to be seen by your caregiver.   SIGNS OF LABOR    Menstrual-like cramps.   Contractions that are 5 minutes apart or less.   Contractions that start on the top of the uterus and spread down to the lower abdomen and back.   A sense of increased pelvic pressure or back pain.   A watery or bloody mucus discharge that comes from the vagina.  If you have any of these signs before the 37th week of pregnancy, call your caregiver right away. You need to go to the hospital to get checked immediately.  HOME CARE INSTRUCTIONS    Avoid all  smoking, herbs, alcohol, and unprescribed drugs. These chemicals affect the formation and growth of the baby.   Follow your caregiver's instructions regarding medicine use. There are medicines that are either safe or unsafe to take during pregnancy.   Exercise only as directed by your caregiver. Experiencing uterine cramps is a good sign to stop exercising.   Continue to eat regular, healthy meals.   Wear a good support bra for breast tenderness.   Do not use hot tubs, steam rooms, or saunas.   Wear your seat belt at all times when driving.   Avoid raw meat, uncooked cheese, cat litter boxes, and soil used by cats. These carry germs that can cause birth defects in the baby.   Take your prenatal vitamins.   Try taking a stool softener (if your caregiver approves) if you develop constipation. Eat more high-fiber foods, such as fresh vegetables or fruit and whole grains. Drink plenty of fluids to keep your urine clear or pale yellow.   Take warm sitz baths to soothe any pain or discomfort caused by hemorrhoids. Use hemorrhoid cream if your caregiver approves.   If you develop varicose veins, wear support hose. Elevate your feet for 15 minutes, 3 4 times a day. Limit salt in your diet.   Avoid heavy lifting, wear low heal shoes, and practice good posture.   Rest a lot with your legs elevated if you have leg cramps or low back pain.   Visit your dentist if you have not gone during your pregnancy. Use a soft toothbrush to brush your teeth and be gentle when you floss.   A sexual relationship may be continued unless your caregiver directs you otherwise.   Do not travel far distances unless it is absolutely necessary and only with the approval of your caregiver.   Take prenatal classes to understand, practice, and ask questions about the labor and delivery.   Make a trial run to the hospital.   Pack your hospital bag.   Prepare the baby's nursery.   Continue to go to all your prenatal visits as directed  by your caregiver.  SEEK MEDICAL CARE IF:   You are unsure if you are in labor or if your water has broken.   You have dizziness.   You have mild pelvic cramps, pelvic pressure, or nagging pain in your abdominal area.   You have persistent nausea, vomiting, or diarrhea.   You have a bad smelling vaginal discharge.   You have pain with urination.  SEEK IMMEDIATE MEDICAL CARE IF:    You have a fever.   You are leaking fluid from your vagina.   You have spotting or bleeding from your vagina.     You have severe abdominal cramping or pain.   You have rapid weight loss or gain.   You have shortness of breath with chest pain.   You notice sudden or extreme swelling of your face, hands, ankles, feet, or legs.   You have not felt your baby move in over an hour.   You have severe headaches that do not go away with medicine.   You have vision changes.  Document Released: 10/18/2001 Document Revised: 06/26/2013 Document Reviewed: 12/25/2012  [employer] Patient Information 2014 [employer].

## 2013-10-14 NOTE — Progress Notes (Signed)
Doing well 

## 2013-10-24 ENCOUNTER — Encounter (HOSPITAL_COMMUNITY): Payer: Self-pay | Admitting: *Deleted

## 2013-10-24 ENCOUNTER — Inpatient Hospital Stay (HOSPITAL_COMMUNITY)
Admission: AD | Admit: 2013-10-24 | Discharge: 2013-10-27 | DRG: 778 | Disposition: A | Discharge status: Home / Self Care | Source: Ambulatory Visit | Attending: Obstetrics | Admitting: Obstetrics

## 2013-10-24 ENCOUNTER — Encounter: Admitting: Obstetrics & Gynecology

## 2013-10-24 DIAGNOSIS — IMO0002 Reserved for concepts with insufficient information to code with codable children: Secondary | ICD-10-CM

## 2013-10-24 DIAGNOSIS — O09213 Supervision of pregnancy with history of pre-term labor, third trimester: Secondary | ICD-10-CM

## 2013-10-24 DIAGNOSIS — O09219 Supervision of pregnancy with history of pre-term labor, unspecified trimester: Secondary | ICD-10-CM

## 2013-10-24 DIAGNOSIS — O26839 Pregnancy related renal disease, unspecified trimester: Secondary | ICD-10-CM | POA: Diagnosis present

## 2013-10-24 DIAGNOSIS — O4700 False labor before 37 completed weeks of gestation, unspecified trimester: Principal | ICD-10-CM | POA: Diagnosis present

## 2013-10-24 DIAGNOSIS — Z905 Acquired absence of kidney: Secondary | ICD-10-CM

## 2013-10-24 DIAGNOSIS — O479 False labor, unspecified: Secondary | ICD-10-CM

## 2013-10-24 DIAGNOSIS — N189 Chronic kidney disease, unspecified: Secondary | ICD-10-CM | POA: Diagnosis present

## 2013-10-24 LAB — CBC
Hemoglobin: 12.1 g/dL (ref 12.0–15.0)
MCH: 32 pg (ref 26.0–34.0)
MCHC: 35.2 g/dL (ref 30.0–36.0)
MCV: 91 fL (ref 78.0–100.0)
RBC: 3.78 MIL/uL — ABNORMAL LOW (ref 3.87–5.11)

## 2013-10-24 LAB — WET PREP, GENITAL
Clue Cells Wet Prep HPF POC: NONE SEEN
Yeast Wet Prep HPF POC: NONE SEEN

## 2013-10-24 LAB — URINALYSIS, ROUTINE W REFLEX MICROSCOPIC
Bilirubin Urine: NEGATIVE
Glucose, UA: NEGATIVE mg/dL
Hgb urine dipstick: NEGATIVE
Protein, ur: NEGATIVE mg/dL
Specific Gravity, Urine: 1.015 (ref 1.005–1.030)
Urobilinogen, UA: 0.2 mg/dL (ref 0.0–1.0)

## 2013-10-24 LAB — URINE MICROSCOPIC-ADD ON

## 2013-10-24 LAB — OB RESULTS CONSOLE GBS: GBS: NEGATIVE

## 2013-10-24 LAB — OB RESULTS CONSOLE GC/CHLAMYDIA: Gonorrhea: NEGATIVE

## 2013-10-24 LAB — GROUP B STREP BY PCR: Group B strep by PCR: NEGATIVE

## 2013-10-24 MED ORDER — MORPHINE SULFATE 10 MG/ML IJ SOLN
10.0000 mg | Freq: Once | INTRAMUSCULAR | Status: AC
  Administered 2013-10-24: 10 mg via INTRAMUSCULAR
  Filled 2013-10-24: qty 1

## 2013-10-24 MED ORDER — INFLUENZA VAC SPLIT QUAD 0.5 ML IM SUSP
0.5000 mL | INTRAMUSCULAR | Status: DC
  Filled 2013-10-24: qty 0.5

## 2013-10-24 MED ORDER — CLINDAMYCIN PHOSPHATE 900 MG/50ML IV SOLN
900.0000 mg | Freq: Three times a day (TID) | INTRAVENOUS | Status: DC
  Administered 2013-10-24: 900 mg via INTRAVENOUS
  Filled 2013-10-24 (×2): qty 50

## 2013-10-24 MED ORDER — MAGNESIUM SULFATE 40 G IN LACTATED RINGERS - SIMPLE
2.0000 g/h | INTRAVENOUS | Status: DC
  Administered 2013-10-25: 2 g/h via INTRAVENOUS
  Filled 2013-10-24 (×2): qty 500

## 2013-10-24 MED ORDER — GENTAMICIN SULFATE 40 MG/ML IJ SOLN
Freq: Three times a day (TID) | INTRAVENOUS | Status: DC
  Administered 2013-10-24 – 2013-10-25 (×3): via INTRAVENOUS
  Filled 2013-10-24 (×5): qty 3.25

## 2013-10-24 MED ORDER — LACTATED RINGERS IV SOLN
INTRAVENOUS | Status: DC
  Administered 2013-10-24: 21:00:00 via INTRAVENOUS
  Administered 2013-10-24: 100 mL via INTRAVENOUS
  Administered 2013-10-25 – 2013-10-26 (×3): via INTRAVENOUS

## 2013-10-24 MED ORDER — GENTAMICIN SULFATE 40 MG/ML IJ SOLN
Freq: Three times a day (TID) | INTRAVENOUS | Status: DC
  Filled 2013-10-24: qty 208

## 2013-10-24 MED ORDER — ACETAMINOPHEN 325 MG PO TABS: 650.0000 mg | ORAL_TABLET | ORAL | Status: DC | PRN

## 2013-10-24 MED ORDER — ZOLPIDEM TARTRATE 5 MG PO TABS: 5.0000 mg | ORAL_TABLET | Freq: Every evening | ORAL | Status: DC | PRN

## 2013-10-24 MED ORDER — LACTATED RINGERS IV BOLUS (SEPSIS)
1000.0000 mL | Freq: Once | INTRAVENOUS | Status: AC
  Administered 2013-10-24: 1000 mL via INTRAVENOUS

## 2013-10-24 MED ORDER — DOCUSATE SODIUM 100 MG PO CAPS
100.0000 mg | ORAL_CAPSULE | Freq: Every day | ORAL | Status: DC
  Administered 2013-10-25: 100 mg via ORAL
  Filled 2013-10-24 (×2): qty 1

## 2013-10-24 MED ORDER — CALCIUM CARBONATE ANTACID 500 MG PO CHEW
1.0000 | CHEWABLE_TABLET | Freq: Every day | ORAL | Status: DC | PRN
  Filled 2013-10-24: qty 1

## 2013-10-24 MED ORDER — PRENATAL MULTIVITAMIN CH: 1.0000 | ORAL_TABLET | Freq: Every day | ORAL | Status: DC

## 2013-10-24 MED ORDER — PROMETHAZINE HCL 25 MG/ML IJ SOLN
25.0000 mg | Freq: Once | INTRAMUSCULAR | Status: AC
  Administered 2013-10-24: 25 mg via INTRAMUSCULAR
  Filled 2013-10-24: qty 1

## 2013-10-24 MED ORDER — PRENATAL MULTIVITAMIN CH
1.0000 | ORAL_TABLET | Freq: Every day | ORAL | Status: DC
  Administered 2013-10-24 – 2013-10-26 (×3): 1 via ORAL
  Filled 2013-10-24 (×4): qty 1

## 2013-10-24 MED ORDER — MAGNESIUM SULFATE BOLUS VIA INFUSION
4.0000 g | Freq: Once | INTRAVENOUS | Status: AC
  Administered 2013-10-24: 4 g via INTRAVENOUS
  Filled 2013-10-24: qty 500

## 2013-10-24 MED ORDER — CALCIUM CARBONATE ANTACID 500 MG PO CHEW
2.0000 | CHEWABLE_TABLET | ORAL | Status: DC | PRN
  Filled 2013-10-24: qty 2

## 2013-10-24 MED ORDER — GENTAMICIN SULFATE 40 MG/ML IJ SOLN
130.0000 mg | Freq: Once | INTRAVENOUS | Status: AC
  Administered 2013-10-24: 130 mg via INTRAVENOUS
  Filled 2013-10-24: qty 3.25

## 2013-10-24 NOTE — H&P (Signed)
Diane Thompson is a 22 y.o. female presenting for UC's. Maternal Medical History:  Reason for admission: Contractions.  22 yo G1.  EDC 12-01-12.  Presents with UC's.  Contractions: Frequency: regular.    Fetal activity: Perceived fetal activity is normal.   Last perceived fetal movement was within the past hour.    Prenatal complications: no prenatal complications Prenatal Complications - Diabetes: none.    OB History   Grav Para Term Preterm Abortions TAB SAB Ect Mult Living   1              Past Medical History  Diagnosis Date  . Chronic kidney disease     Only left kidney function   History reviewed. No pertinent past surgical history. Family History: family history is not on file. Social History:  reports that she has never smoked. She does not have any smokeless tobacco history on file. She reports that she does not drink alcohol or use illicit drugs.   Prenatal Transfer Tool  Maternal Diabetes: No Genetic Screening: Normal Maternal Ultrasounds/Referrals: Normal Fetal Ultrasounds or other Referrals:  None Maternal Substance Abuse:  No Significant Maternal Medications:  None Significant Maternal Lab Results:  None Other Comments:  None  Review of Systems  All other systems reviewed and are negative.    Dilation: 1.5 Effacement (%): 70 Station: 0 Exam by:: V.Smith,CNM Blood pressure 123/89, pulse 89, temperature 98.4 F (36.9 C), temperature source Oral, resp. rate 20, last menstrual period 02/24/2013. Maternal Exam:  Uterine Assessment: Contraction strength is moderate.  Abdomen: Patient reports no abdominal tenderness. Fetal presentation: vertex  Pelvis: adequate for delivery.   Cervix: Cervix evaluated by sterile speculum exam and digital exam.     Physical Exam  Nursing note and vitals reviewed. Constitutional: She is oriented to person, place, and time. She appears well-developed and well-nourished.  HENT:  Head: Normocephalic and atraumatic.   Eyes: Conjunctivae are normal. Pupils are equal, round, and reactive to light.  Neck: Normal range of motion. Neck supple.  Cardiovascular: Normal rate and regular rhythm.   Respiratory: Effort normal and breath sounds normal.  GI: Soft.  Genitourinary: Vagina normal and uterus normal.  Musculoskeletal: Normal range of motion.  Neurological: She is alert and oriented to person, place, and time.  Skin: Skin is warm and dry.  Psychiatric: She has a normal mood and affect. Her behavior is normal. Judgment and thought content normal.    Prenatal labs: ABO, Rh: O/POS/-- (07/14 1401) Antibody: NEG (07/14 1401) Rubella: 1.99 (07/14 1401) RPR: NON REAC (10/09 1047)  HBsAg: NEGATIVE (07/14 1401)  HIV: NON REACTIVE (10/09 1047)  GBS:     Assessment/Plan: 34.4 weeks.  PTL.  Admit.  Magnesium Sulfate.   Seymore Brodowski A 10/24/2013, 7:54 AM

## 2013-10-24 NOTE — MAU Note (Signed)
Pt reports contractions since 0100

## 2013-10-24 NOTE — Progress Notes (Signed)
ANTIBIOTIC CONSULT NOTE - INITIAL  Pharmacy Consult for Gentamicin Indication: Prophylaxis for pre-term labor  Allergies  Allergen Reactions  . Penicillins Hives    Patient Measurements: Height: 5\' 4"  (162.6 cm) Weight: 141 lb (63.957 kg) IBW/kg (Calculated) : 54.7  Vital Signs: Temp: 98.4 F (36.9 C) (12/18 0633) Temp src: Oral (12/18 9562) BP: 123/89 mmHg (12/18 0633) Pulse Rate: 89 (12/18 0633)  Labs:  Recent Labs  10/24/13 0748  WBC 16.8*  HGB 12.1  PLT 237    Medications:  Clindamycin 900mg  IV Q8h  Assessment: 22 y.o. female G1P0 at [redacted]w[redacted]d being initiated on clindamycin and gentamicin due to pre-term labor.  Estimated Ke = 0.286 , Vd = 0.35 L/kg  Goal of Therapy:  Gentamicin peak 6-8 mg/L and Trough < 1 mg/L  Plan:  Gentamicin 130 mg IV every 8 hrs  Check Scr with next labs if gentamicin continued. Will check gentamicin levels if continued > 72hr or clinically indicated.  Diane Thompson 10/24/2013,8:29 AM

## 2013-10-24 NOTE — MAU Provider Note (Signed)
Chief Complaint:  Labor Eval   First Provider Initiated Contact with Patient 10/24/13 [phone]      HPI: Diane Thompson is a 22 y.o. G1P0 at [redacted]w[redacted]d who presents to maternity admissions reporting  contractions since 0100. Denies leakage of fluid, vaginal bleeding, urinary complaints GI complaints, or vaginal discharge. Good fetal movement.   Pregnancy Course: Uncomplicated  Past Medical History: Past Medical History  Diagnosis Date  . Chronic kidney disease     Only left kidney function    Past obstetric history: OB History  Gravida Para Term Preterm AB SAB TAB Ectopic Multiple Living  1             # Outcome Date GA Lbr Len/2nd Weight Sex Delivery Anes PTL Lv  1 CUR               Past Surgical History: History reviewed. No pertinent past surgical history.   Family History: History reviewed. No pertinent family history.  Social History: History  Substance Use Topics  . Smoking status: Never Smoker   . Smokeless tobacco: Not on file  . Alcohol Use: No    Allergies:  Allergies  Allergen Reactions  . Penicillins Hives    Meds:  Prescriptions prior to admission  Medication Sig Dispense Refill  . calcium carbonate (TUMS - DOSED IN MG ELEMENTAL CALCIUM) 500 MG chewable tablet Chew 1 tablet by mouth daily as needed for indigestion or heartburn.      . Prenatal Vit-Fe Fumarate-FA (PRENATAL MULTIVITAMIN) TABS Take 1 tablet by mouth daily at 12 noon.        ROS: Pertinent findings in history of present illness.  Physical Exam  Blood pressure 123/89, pulse 89, temperature 98.4 F (36.9 C), temperature source Oral, resp. rate 20, height 5\' 4"  (1.626 m), weight 63.957 kg (141 lb), last menstrual period 02/24/2013. GENERAL: Well-developed, well-nourished female in moderate distress.  HEENT: normocephalic HEART: normal rate RESP: normal effort ABDOMEN: Soft, non-tender, gravid appropriate for gestational age EXTREMITIES: Nontender, no edema NEURO: alert and  oriented SPECULUM EXAM: NEFG, physiologic discharge, no blood, cervix clean Dilation: 1.5 Effacement (%): 70 Cervical Position: Posterior Station: 0 Presentation: Vertex Exam by:: V.Rosangelica Pevehouse,CNM  FHT:  Baseline 120 , moderate variability, accelerations present, no decelerations Contractions: q 2-4 mins, moderate   Labs: Results for orders placed during the hospital encounter of 10/24/13 (from the past 24 hour(s))  URINALYSIS, ROUTINE W REFLEX MICROSCOPIC     Status: Abnormal   Collection Time    10/24/13  6:20 AM      Result Value Range   Color, Urine YELLOW  YELLOW   APPearance CLEAR  CLEAR   Specific Gravity, Urine 1.015  1.005 - 1.030   pH 6.5  5.0 - 8.0   Glucose, UA NEGATIVE  NEGATIVE mg/dL   Hgb urine dipstick NEGATIVE  NEGATIVE   Bilirubin Urine NEGATIVE  NEGATIVE   Ketones, ur NEGATIVE  NEGATIVE mg/dL   Protein, ur NEGATIVE  NEGATIVE mg/dL   Urobilinogen, UA 0.2  0.0 - 1.0 mg/dL   Nitrite NEGATIVE  NEGATIVE   Leukocytes, UA SMALL (*) NEGATIVE  URINE MICROSCOPIC-ADD ON     Status: None   Collection Time    10/24/13  6:20 AM      Result Value Range   Squamous Epithelial / LPF RARE  RARE   WBC, UA 3-6  <3 WBC/hpf   RBC / HPF 0-2  <3 RBC/hpf   Bacteria, UA RARE  RARE  CBC  Status: Abnormal   Collection Time    10/24/13  7:48 AM      Result Value Range   WBC 16.8 (*) 4.0 - 10.5 K/uL   RBC 3.78 (*) 3.87 - 5.11 MIL/uL   Hemoglobin 12.1  12.0 - 15.0 g/dL   HCT 16.1 (*) 09.6 - 04.5 %   MCV 91.0  78.0 - 100.0 fL   MCH 32.0  26.0 - 34.0 pg   MCHC 35.2  30.0 - 36.0 g/dL   RDW 40.9  81.1 - 91.4 %   Platelets 237  150 - 400 K/uL  WET PREP, GENITAL     Status: Abnormal   Collection Time    10/24/13  7:50 AM      Result Value Range   Yeast Wet Prep HPF POC NONE SEEN  NONE SEEN   Trich, Wet Prep NONE SEEN  NONE SEEN   Clue Cells Wet Prep HPF POC NONE SEEN  NONE SEEN   WBC, Wet Prep HPF POC MANY (*) NONE SEEN    Imaging:  No results found.  MAU Course: IV bolus  given. No decrease in UC's.   Assessment: Preterm labor  Plan: Admit to Antenatal per Dr.Harper. Mag Sulfate. GC/CT, GBS pending.   [place], CNM 10/24/2013 8:28 AM

## 2013-10-25 LAB — CREATININE, SERUM: GFR calc Af Amer: >90 mL/min (ref >90)

## 2013-10-25 LAB — CULTURE, OB URINE: Colony Count: NO GROWTH

## 2013-10-25 LAB — GC/CHLAMYDIA PROBE AMP: GC Probe RNA: NEGATIVE

## 2013-10-25 MED ORDER — MORPHINE SULFATE 10 MG/ML IJ SOLN
10.0000 mg | Freq: Once | INTRAMUSCULAR | Status: AC
  Administered 2013-10-25: 10 mg via INTRAMUSCULAR
  Filled 2013-10-25: qty 1

## 2013-10-25 MED ORDER — PROMETHAZINE HCL 25 MG/ML IJ SOLN
25.0000 mg | Freq: Once | INTRAMUSCULAR | Status: AC
  Administered 2013-10-25: 25 mg via INTRAMUSCULAR
  Filled 2013-10-25: qty 1

## 2013-10-25 NOTE — Progress Notes (Signed)
MD to come and assess pt

## 2013-10-25 NOTE — Progress Notes (Addendum)
Diane Thompson is a 22 y.o. G1P0 at [redacted]w[redacted]d by LMP admitted for Preterm labor Antepartum  Subjective:  Patient reports feeling better. She still continues to have contractions but they are not as painful and less frequent. The pressure has improved.  Patient ROS is negative. She denies SOB or other neuro symptoms.    Mucous blood tinged discharge this am.  Objective: BP 118/70  Pulse 90  Temp(Src) 98.2 F (36.8 C) (Oral)  Resp 18  Ht 5\' 4"  (1.626 m)  Wt 141 lb (63.957 kg)  BMI 24.19 kg/m2  SpO2 99%  LMP 02/24/2013 I/O last 3 completed shifts: In: 3459.6 [P.O.:720; I.V.:2739.6] Out: - 600cc out this am (will request record keeping of this)    FHT:  FHR: 130 bpm, variability: moderate,  accelerations:  Present,  decelerations:  Absent UC:   irregular, every 10-15 minutes SVE:   Dilation: 1.5 Effacement (%): 70 Station: 0 Exam by:: V.Smith,CNM  Labs: Lab Results  Component Value Date   WBC 16.8* 10/24/2013   HGB 12.1 10/24/2013   HCT 34.4* 10/24/2013   MCV 91.0 10/24/2013   PLT 237 10/24/2013    Assessment / Plan: Threatened Preterm Labor, Not in active labor Cont to monitor closely I/O monitoring On Magnesium Sulfate 2 GM D/C'd Gentamicin Will consult w/ MD Clearance Coots for plan of care  Labor: Not in active labor Preeclampsia:  intake and ouput balanced and no evidence of preE Fetal Wellbeing:  Category I Pain Control:  no medications currently I/D:  n/a Anticipated MOD:  NSVD  Diane Thompson 10/25/2013, 9:38 AM  Addendum: 11:20 Patient reports strong more frequent contractions. MD Clearance Coots will come evaluate. Sherian Rein CNM

## 2013-10-25 NOTE — Progress Notes (Signed)
Pt stating that she had some mucous bloody vaginal discharge that she noted after voiding.  Pt denies vaginal bleeding and the need to wear a peripad.

## 2013-10-25 NOTE — Progress Notes (Signed)
MD calling to check on pt, pt resting at this time.  Fewer contractions

## 2013-10-25 NOTE — Progress Notes (Signed)
Amy Wrenn CNM returning a call to RN that Dr. Clearance Coots would be coming to evaluate pt status and determine POC.

## 2013-10-25 NOTE — Progress Notes (Signed)
UR completed 

## 2013-10-25 NOTE — Progress Notes (Signed)
Amy Wrenn CNM notified of pt status, contractions every 5-7 min and getting painful.  Pt rates at 5-6/10.  Pt denies  VB  except for a smear noted on the peri pad

## 2013-10-26 MED ORDER — SODIUM CHLORIDE 0.9 % IJ SOLN: 3.0000 mL | INTRAMUSCULAR | Status: DC | PRN

## 2013-10-26 MED ORDER — SODIUM CHLORIDE 0.9 % IJ SOLN
3.0000 mL | Freq: Two times a day (BID) | INTRAMUSCULAR | Status: DC
  Administered 2013-10-26: 3 mL via INTRAVENOUS

## 2013-10-26 MED ORDER — NIFEDIPINE ER 30 MG PO TB24
30.0000 mg | ORAL_TABLET | Freq: Every day | ORAL | Status: DC
  Administered 2013-10-26 – 2013-10-27 (×2): 30 mg via ORAL
  Filled 2013-10-26 (×2): qty 1

## 2013-10-26 NOTE — Progress Notes (Signed)
Pt. Was complaining of painful contractions. MD notified and new orders given for IM pain meds. Pt. Refused medication at this time and said she thinks they are tolerable enough for now. Will continue to monitor

## 2013-10-26 NOTE — Progress Notes (Signed)
Patient ID: Diane Thompson, female   DOB: 1991/03/21, 22 y.o.   MRN: [id number] Hospital Day: 3  S: Preterm labor symptoms: UC's  O: Blood pressure 106/53, pulse 79, temperature 98.2 F (36.8 C), temperature source Oral, resp. rate 18, height 5\' 4"  (1.626 m), weight 141 lb (63.957 kg), last menstrual period 02/24/2013, SpO2 99.00%.   NWG:NFAOZHYQ: 140-150 bpm Toco: 4-5 UC's per hour, mild MVH:QIONGEXB: 1.5 Effacement (%): 70 Cervical Position: Posterior Station: -2 Presentation: Vertex Exam by:: Lynda Rainwater RN   A/P- 22 y.o. admitted with:  UC's.  Stable.  D/C magnesium sulfate.  Start po Procardia.  Present on Admission:  **None**  Pregnancy Complications: preterm labor   Preterm labor management: IV D5LR started, pelvic rest advised, modified bedrest advised and magnesium sulfate Dating:  [redacted]w[redacted]d PNL Needed:  none FWB:  good PTL:  stable ROD: spontaneous vaginal

## 2013-10-27 MED ORDER — NIFEDIPINE ER 30 MG PO TB24: 30.0000 mg | ORAL_TABLET | Freq: Two times a day (BID) | ORAL | Status: DC

## 2013-10-27 MED ORDER — HYDROMORPHONE HCL 4 MG PO TABS: 4.0000 mg | ORAL_TABLET | Freq: Four times a day (QID) | ORAL | Status: DC | PRN

## 2013-10-27 NOTE — Discharge Summary (Signed)
Physician Discharge Summary  Patient ID: Diane Thompson MRN: [id number] DOB/AGE: 1991-07-22 22 y.o.  Admit date: 10/24/2013 Discharge date: 10/27/2013  Admission Diagnoses:  PTL  Discharge Diagnoses:  Same Active Problems:   Hx of PTL (preterm labor), current pregnancy   Discharged Condition: good  Hospital Course: Admitted with UC's.  Responded well to therapy.  Discharged home in good condition, undelivered, at [redacted] weeks gestation.  Consults: None  Significant Diagnostic Studies: labs: CBC  Treatments: IV hydration,  analgesia: Morphine and magnesium sulfate  Discharge Exam: Blood pressure 110/70, pulse 58, temperature 98.5 F (36.9 C), temperature source Oral, resp. rate 18, height 5\' 4"  (1.626 m), weight 141 lb (63.957 kg), last menstrual period 02/24/2013, SpO2 99.00%. General appearance: alert and no distress GI: normal findings: soft, non-tender  Disposition: Final discharge disposition not confirmed  Discharge Orders   Future Orders Complete By Expires   Discharge activity:  As directed    Discharge diet:  No restrictions  As directed    Discharge instructions  As directed    Comments:     Routine   Do not have sex or do anything that might make you have an orgasm  As directed    Notify physician for a general feeling that "something is not right"  As directed    Notify physician for increase or change in vaginal discharge  As directed    Notify physician for intestinal cramps, with or without diarrhea, sometimes described as "gas pain"  As directed    Notify physician for leaking of fluid  As directed    Notify physician for low, dull backache, unrelieved by heat or Tylenol  As directed    Notify physician for menstrual like cramps  As directed    Notify physician for pelvic pressure  As directed    Notify physician for uterine contractions.  These may be painless and feel like the uterus is tightening or the baby is  "balling up"  As directed    Notify  physician for vaginal bleeding  As directed    PRETERM LABOR:  Includes any of the follwing symptoms that occur between 20 - [redacted] weeks gestation.  If these symptoms are not stopped, preterm labor can result in preterm delivery, placing your baby at risk  As directed        Medication List         calcium carbonate 500 MG chewable tablet  Commonly known as:  TUMS - dosed in mg elemental calcium  Chew 1 tablet by mouth daily as needed for indigestion or heartburn.     HYDROmorphone 4 MG tablet  Commonly known as:  DILAUDID  Take 1 tablet (4 mg total) by mouth every 6 (six) hours as needed for moderate pain or severe pain.     NIFEdipine 30 MG 24 hr tablet  Commonly known as:  PROCARDIA-XL/ADALAT CC  Take 1 tablet (30 mg total) by mouth every 12 (twelve) hours.     prenatal multivitamin Tabs tablet  Take 1 tablet by mouth daily at 12 noon.           Follow-up Information   Follow up with Antionette Char A, MD. Schedule an appointment as soon as possible for a visit in 1 week.   Specialty:  Obstetrics and Gynecology   Contact information:   [address] [place] [address] [phone]       Signed: Brock Bad 10/27/2013, 6:13 AM

## 2013-10-27 NOTE — Progress Notes (Signed)
Pt. Is stable and ready to be discharged home. All discharge instructions given and prescriptions given and reviewed. I confirmed with Dr. Clearance Coots that he did not want to check her cervix although she had been contracting because her contractions were not strong. I educated the patient on when she needs to call the doctor (if the contractions get more intense and closer together). All belongings are with the patient. Patient really wanted to walk out and refused to be in the wheelchair. Pt. Walked out with her husband. She will follow up with her doctor.

## 2013-10-27 NOTE — Progress Notes (Signed)
Patient ID: Diane Thompson, female   DOB: 1991/09/01, 22 y.o.   MRN: [id number] Hospital Day: 4  S: Preterm labor symptoms: UC's  O: Blood pressure 110/69, pulse 67, temperature 98.5 F (36.9 C), temperature source Oral, resp. rate 18, height 5\' 4"  (1.626 m), weight 141 lb (63.957 kg), last menstrual period 02/24/2013, SpO2 99.00%.   WUJ:WJXBJYNW: 150 bpm Toco: UC's, occasional runs. GNF:AOZHYQMV: 1.5 Effacement (%): 70 Cervical Position: Posterior Station: -2 Presentation: Vertex Exam by:: Lynda Rainwater RN   A/P- 22 y.o. admitted with:  UC's.  Irritable and patient sensitive uterine contractions, but not laboring.  Stable.  Will discharge home on Procardia until 37 weeks.   Present on Admission:  **None**  Pregnancy Complications: preterm labor   Preterm labor management: pelvic rest advised, modified bedrest advised and Procardia. Dating:  [redacted]w[redacted]d PNL Needed:  None FWB:  good PTL:  stable ROD: spontaneous vaginal

## 2013-11-04 ENCOUNTER — Ambulatory Visit (INDEPENDENT_AMBULATORY_CARE_PROVIDER_SITE_OTHER): Admitting: Obstetrics & Gynecology

## 2013-11-04 ENCOUNTER — Encounter: Admitting: Obstetrics & Gynecology

## 2013-11-04 VITALS — BP 116/76 | Temp 98.9°F | Wt 146.0 lb

## 2013-11-04 DIAGNOSIS — Z3403 Encounter for supervision of normal first pregnancy, third trimester: Secondary | ICD-10-CM

## 2013-11-04 DIAGNOSIS — Z3400 Encounter for supervision of normal first pregnancy, unspecified trimester: Secondary | ICD-10-CM

## 2013-11-04 LAB — POCT URINALYSIS DIPSTICK
Blood, UA: NEGATIVE
Glucose, UA: NEGATIVE
Protein, UA: NEGATIVE
Spec Grav, UA: 1.02
Urobilinogen, UA: NEGATIVE
pH, UA: 5

## 2013-11-04 NOTE — Progress Notes (Signed)
HR - 88 Pt in office today for routine OB visit, denies any concerns at this time

## 2013-11-07 NOTE — L&D Delivery Note (Signed)
Delivery Note At 2:06 PM a viable female was delivered via Vaginal, Spontaneous Delivery (Presentation: LOA ).  APGAR: 9, 9 .   Placenta status: Intact, Spontaneous.  Cord: 3 vessels with the following complications: None.    Anesthesia: Epidural  Episiotomy: None Lacerations: 2nd degree;Labial Suture Repair: 2.0 3.0 vicryl rapide Est. Blood Loss (mL): 300 ml  Mom to postpartum.  Baby to Couplet care / Skin to Skin.  JACKSON-MOORE,Geri Hepler A 11/20/2013, 2:42 PM

## 2013-11-11 ENCOUNTER — Ambulatory Visit (INDEPENDENT_AMBULATORY_CARE_PROVIDER_SITE_OTHER): Admitting: Obstetrics & Gynecology

## 2013-11-11 VITALS — BP 119/75 | Temp 98.3°F | Wt 145.0 lb

## 2013-11-11 DIAGNOSIS — Z3400 Encounter for supervision of normal first pregnancy, unspecified trimester: Secondary | ICD-10-CM

## 2013-11-11 DIAGNOSIS — Z3403 Encounter for supervision of normal first pregnancy, third trimester: Secondary | ICD-10-CM

## 2013-11-11 LAB — POCT URINALYSIS DIPSTICK
BILIRUBIN UA: NEGATIVE
GLUCOSE UA: NEGATIVE
Ketones, UA: NEGATIVE
LEUKOCYTES UA: NEGATIVE
Nitrite, UA: NEGATIVE
PH UA: 6
Protein, UA: NEGATIVE
RBC UA: NEGATIVE
Spec Grav, UA: 1.015
UROBILINOGEN UA: NEGATIVE

## 2013-11-11 NOTE — Patient Instructions (Signed)
Third Trimester of Pregnancy  The third trimester is from week 29 through week 42, months 7 through 9. The third trimester is a time when the fetus is growing rapidly. At the end of the ninth month, the fetus is about 20 inches in length and weighs 6 10 pounds.   BODY CHANGES  Your body goes through many changes during pregnancy. The changes vary from woman to woman.    Your weight will continue to increase. You can expect to gain 25 35 pounds (11 16 kg) by the end of the pregnancy.   You may begin to get stretch marks on your hips, abdomen, and breasts.   You may urinate more often because the fetus is moving lower into your pelvis and pressing on your bladder.   You may develop or continue to have heartburn as a result of your pregnancy.   You may develop constipation because certain hormones are causing the muscles that push waste through your intestines to slow down.   You may develop hemorrhoids or swollen, bulging veins (varicose veins).   You may have pelvic pain because of the weight gain and pregnancy hormones relaxing your joints between the bones in your pelvis. Back aches may result from over exertion of the muscles supporting your posture.   Your breasts will continue to grow and be tender. A yellow discharge may leak from your breasts called colostrum.   Your belly button may stick out.   You may feel short of breath because of your expanding uterus.   You may notice the fetus "dropping," or moving lower in your abdomen.   You may have a bloody mucus discharge. This usually occurs a few days to a week before labor begins.   Your cervix becomes thin and soft (effaced) near your due date.  WHAT TO EXPECT AT YOUR PRENATAL EXAMS   You will have prenatal exams every 2 weeks until week 36. Then, you will have weekly prenatal exams. During a routine prenatal visit:   You will be weighed to make sure you and the fetus are growing normally.   Your blood pressure is taken.   Your abdomen will be  measured to track your baby's growth.   The fetal heartbeat will be listened to.   Any test results from the previous visit will be discussed.   You may have a cervical check near your due date to see if you have effaced.  At around 36 weeks, your caregiver will check your cervix. At the same time, your caregiver will also perform a test on the secretions of the vaginal tissue. This test is to determine if a type of bacteria, Group B streptococcus, is present. Your caregiver will explain this further.  Your caregiver may ask you:   What your birth plan is.   How you are feeling.   If you are feeling the baby move.   If you have had any abnormal symptoms, such as leaking fluid, bleeding, severe headaches, or abdominal cramping.   If you have any questions.  Other tests or screenings that may be performed during your third trimester include:   Blood tests that check for low iron levels (anemia).   Fetal testing to check the health, activity level, and growth of the fetus. Testing is done if you have certain medical conditions or if there are problems during the pregnancy.  FALSE LABOR  You may feel small, irregular contractions that eventually go away. These are called Braxton Hicks contractions, or   false labor. Contractions may last for hours, days, or even weeks before true labor sets in. If contractions come at regular intervals, intensify, or become painful, it is best to be seen by your caregiver.   SIGNS OF LABOR    Menstrual-like cramps.   Contractions that are 5 minutes apart or less.   Contractions that start on the top of the uterus and spread down to the lower abdomen and back.   A sense of increased pelvic pressure or back pain.   A watery or bloody mucus discharge that comes from the vagina.  If you have any of these signs before the 37th week of pregnancy, call your caregiver right away. You need to go to the hospital to get checked immediately.  HOME CARE INSTRUCTIONS    Avoid all  smoking, herbs, alcohol, and unprescribed drugs. These chemicals affect the formation and growth of the baby.   Follow your caregiver's instructions regarding medicine use. There are medicines that are either safe or unsafe to take during pregnancy.   Exercise only as directed by your caregiver. Experiencing uterine cramps is a good sign to stop exercising.   Continue to eat regular, healthy meals.   Wear a good support bra for breast tenderness.   Do not use hot tubs, steam rooms, or saunas.   Wear your seat belt at all times when driving.   Avoid raw meat, uncooked cheese, cat litter boxes, and soil used by cats. These carry germs that can cause birth defects in the baby.   Take your prenatal vitamins.   Try taking a stool softener (if your caregiver approves) if you develop constipation. Eat more high-fiber foods, such as fresh vegetables or fruit and whole grains. Drink plenty of fluids to keep your urine clear or pale yellow.   Take warm sitz baths to soothe any pain or discomfort caused by hemorrhoids. Use hemorrhoid cream if your caregiver approves.   If you develop varicose veins, wear support hose. Elevate your feet for 15 minutes, 3 4 times a day. Limit salt in your diet.   Avoid heavy lifting, wear low heal shoes, and practice good posture.   Rest a lot with your legs elevated if you have leg cramps or low back pain.   Visit your dentist if you have not gone during your pregnancy. Use a soft toothbrush to brush your teeth and be gentle when you floss.   A sexual relationship may be continued unless your caregiver directs you otherwise.   Do not travel far distances unless it is absolutely necessary and only with the approval of your caregiver.   Take prenatal classes to understand, practice, and ask questions about the labor and delivery.   Make a trial run to the hospital.   Pack your hospital bag.   Prepare the baby's nursery.   Continue to go to all your prenatal visits as directed  by your caregiver.  SEEK MEDICAL CARE IF:   You are unsure if you are in labor or if your water has broken.   You have dizziness.   You have mild pelvic cramps, pelvic pressure, or nagging pain in your abdominal area.   You have persistent nausea, vomiting, or diarrhea.   You have a bad smelling vaginal discharge.   You have pain with urination.  SEEK IMMEDIATE MEDICAL CARE IF:    You have a fever.   You are leaking fluid from your vagina.   You have spotting or bleeding from your vagina.     You have severe abdominal cramping or pain.   You have rapid weight loss or gain.   You have shortness of breath with chest pain.   You notice sudden or extreme swelling of your face, hands, ankles, feet, or legs.   You have not felt your baby move in over an hour.   You have severe headaches that do not go away with medicine.   You have vision changes.  Document Released: 10/18/2001 Document Revised: 06/26/2013 Document Reviewed: 12/25/2012  [employer] Patient Information 2014 [employer].

## 2013-11-11 NOTE — Progress Notes (Signed)
Pulse 89 Pt states that she is having a lot of pelvic pressure.

## 2013-11-11 NOTE — Patient Instructions (Signed)
Breastfeeding Deciding to breastfeed is one of the best choices you can make for you and your baby. A change in hormones during pregnancy causes your breast tissue to grow and increases the number and size of your milk ducts. These hormones also allow proteins, sugars, and fats from your blood supply to make breast milk in your milk-producing glands. Hormones prevent breast milk from being released before your baby is born as well as prompt milk flow after birth. Once breastfeeding has begun, thoughts of your baby, as well as his or her sucking or crying, can stimulate the release of milk from your milk-producing glands.  BENEFITS OF BREASTFEEDING For Your Baby  Your first milk (colostrum) helps your baby's digestive system function better.   There are antibodies in your milk that help your baby fight off infections.   Your baby has a lower incidence of asthma, allergies, and sudden infant death syndrome.   The nutrients in breast milk are better for your baby than infant formulas and are designed uniquely for your baby's needs.   Breast milk improves your baby's brain development.   Your baby is less likely to develop other conditions, such as childhood obesity, asthma, or type 2 diabetes mellitus.  For You   Breastfeeding helps to create a very special bond between you and your baby.   Breastfeeding is convenient. Breast milk is always available at the correct temperature and costs nothing.   Breastfeeding helps to burn calories and helps you lose the weight gained during pregnancy.   Breastfeeding makes your uterus contract to its prepregnancy size faster and slows bleeding (lochia) after you give birth.   Breastfeeding helps to lower your risk of developing type 2 diabetes mellitus, osteoporosis, and breast or ovarian cancer later in life. SIGNS THAT YOUR BABY IS HUNGRY Early Signs of Hunger  Increased alertness or activity.  Stretching.  Movement of the head from  side to side.  Movement of the head and opening of the mouth when the corner of the mouth or cheek is stroked (rooting).  Increased sucking sounds, smacking lips, cooing, sighing, or squeaking.  Hand-to-mouth movements.  Increased sucking of fingers or hands. Late Signs of Hunger  Fussing.  Intermittent crying. Extreme Signs of Hunger Signs of extreme hunger will require calming and consoling before your baby will be able to breastfeed successfully. Do not wait for the following signs of extreme hunger to occur before you initiate breastfeeding:   Restlessness.  A loud, strong cry.   Screaming. BREASTFEEDING BASICS Breastfeeding Initiation  Find a comfortable place to sit or lie down, with your neck and back well supported.  Place a pillow or rolled up blanket under your baby to bring him or her to the level of your breast (if you are seated). Nursing pillows are specially designed to help support your arms and your baby while you breastfeed.  Make sure that your baby's abdomen is facing your abdomen.   Gently massage your breast. With your fingertips, massage from your chest wall toward your nipple in a circular motion. This encourages milk flow. You may need to continue this action during the feeding if your milk flows slowly.  Support your breast with 4 fingers underneath and your thumb above your nipple. Make sure your fingers are well away from your nipple and your baby's mouth.   Stroke your baby's lips gently with your finger or nipple.   When your baby's mouth is open wide enough, quickly bring your baby to your   breast, placing your entire nipple and as much of the colored area around your nipple (areola) as possible into your baby's mouth.   More areola should be visible above your baby's upper lip than below the lower lip.   Your baby's tongue should be between his or her lower gum and your breast.   Ensure that your baby's mouth is correctly positioned  around your nipple (latched). Your baby's lips should create a seal on your breast and be turned out (everted).  It is common for your baby to suck about 2 3 minutes in order to start the flow of breast milk. Latching Teaching your baby how to latch on to your breast properly is very important. An improper latch can cause nipple pain and decreased milk supply for you and poor weight gain in your baby. Also, if your baby is not latched onto your nipple properly, he or she may swallow some air during feeding. This can make your baby fussy. Burping your baby when you switch breasts during the feeding can help to get rid of the air. However, teaching your baby to latch on properly is still the best way to prevent fussiness from swallowing air while breastfeeding. Signs that your baby has successfully latched on to your nipple:    Silent tugging or silent sucking, without causing you pain.   Swallowing heard between every 3 4 sucks.    Muscle movement above and in front of his or her ears while sucking.  Signs that your baby has not successfully latched on to nipple:   Sucking sounds or smacking sounds from your baby while breastfeeding.  Nipple pain. If you think your baby has not latched on correctly, slip your finger into the corner of your baby's mouth to break the suction and place it between your baby's gums. Attempt breastfeeding initiation again. Signs of Successful Breastfeeding Signs from your baby:   A gradual decrease in the number of sucks or complete cessation of sucking.   Falling asleep.   Relaxation of his or her body.   Retention of a small amount of milk in his or her mouth.   Letting go of your breast by himself or herself. Signs from you:  Breasts that have increased in firmness, weight, and size 1 3 hours after feeding.   Breasts that are softer immediately after breastfeeding.  Increased milk volume, as well as a change in milk consistency and color by  the 5th day of breastfeeding.   Nipples that are not sore, cracked, or bleeding. Signs That Your Baby is Getting Enough Milk  Wetting at least 3 diapers in a 24-hour period. The urine should be clear and pale yellow by age 5 days.  At least 3 stools in a 24-hour period by age 5 days. The stool should be soft and yellow.  At least 3 stools in a 24-hour period by age 7 days. The stool should be seedy and yellow.  No loss of weight greater than 10% of birth weight during the first 3 days of age.  Average weight gain of 4 7 ounces (120 210 mL) per week after age 4 days.  Consistent daily weight gain by age 5 days, without weight loss after the age of 2 weeks. After a feeding, your baby may spit up a small amount. This is common. BREASTFEEDING FREQUENCY AND DURATION Frequent feeding will help you make more milk and can prevent sore nipples and breast engorgement. Breastfeed when you feel the need to reduce   the fullness of your breasts or when your baby shows signs of hunger. This is called "breastfeeding on demand." Avoid introducing a pacifier to your baby while you are working to establish breastfeeding (the first 4 6 weeks after your baby is born). After this time you may choose to use a pacifier. Research has shown that pacifier use during the first year of a baby's life decreases the risk of sudden infant death syndrome (SIDS). Allow your baby to feed on each breast as long as he or she wants. Breastfeed until your baby is finished feeding. When your baby unlatches or falls asleep while feeding from the first breast, offer the second breast. Because newborns are often sleepy in the first few weeks of life, you may need to awaken your baby to get him or her to feed. Breastfeeding times will vary from baby to baby. However, the following rules can serve as a guide to help you ensure that your baby is properly fed:  Newborns (babies 4 weeks of age or younger) may breastfeed every 1 3  hours.  Newborns should not go longer than 3 hours during the day or 5 hours during the night without breastfeeding.  You should breastfeed your baby a minimum of 8 times in a 24-hour period until you begin to introduce solid foods to your baby at around 6 months of age. BREAST MILK PUMPING Pumping and storing breast milk allows you to ensure that your baby is exclusively fed your breast milk, even at times when you are unable to breastfeed. This is especially important if you are going back to work while you are still breastfeeding or when you are not able to be present during feedings. Your lactation consultant can give you guidelines on how long it is safe to store breast milk.  A breast pump is a machine that allows you to pump milk from your breast into a sterile bottle. The pumped breast milk can then be stored in a refrigerator or freezer. Some breast pumps are operated by hand, while others use electricity. Ask your lactation consultant which type will work best for you. Breast pumps can be purchased, but some hospitals and breastfeeding support groups lease breast pumps on a monthly basis. A lactation consultant can teach you how to hand express breast milk, if you prefer not to use a pump.  CARING FOR YOUR BREASTS WHILE YOU BREASTFEED Nipples can become dry, cracked, and sore while breastfeeding. The following recommendations can help keep your breasts moisturized and healthy:  Avoid using soap on your nipples.   Wear a supportive bra. Although not required, special nursing bras and tank tops are designed to allow access to your breasts for breastfeeding without taking off your entire bra or top. Avoid wearing underwire style bras or extremely tight bras.  Air dry your nipples for 3 4minutes after each feeding.   Use only cotton bra pads to absorb leaked breast milk. Leaking of breast milk between feedings is normal.   Use lanolin on your nipples after breastfeeding. Lanolin helps to  maintain your skin's normal moisture barrier. If you use pure lanolin you do not need to wash it off before feeding your baby again. Pure lanolin is not toxic to your baby. You may also hand express a few drops of breast milk and gently massage that milk into your nipples and allow the milk to air dry. In the first few weeks after giving birth, some women experience extremely full breasts (engorgement). Engorgement can make   your breasts feel heavy, warm, and tender to the touch. Engorgement peaks within 3 5 days after you give birth. The following recommendations can help ease engorgement:  Completely empty your breasts while breastfeeding or pumping. You may want to start by applying warm, moist heat (in the shower or with warm water-soaked hand towels) just before feeding or pumping. This increases circulation and helps the milk flow. If your baby does not completely empty your breasts while breastfeeding, pump any extra milk after he or she is finished.  Wear a snug bra (nursing or regular) or tank top for 1 2 days to signal your body to slightly decrease milk production.  Apply ice packs to your breasts, unless this is too uncomfortable for you.  Make sure that your baby is latched on and positioned properly while breastfeeding. If engorgement persists after 48 hours of following these recommendations, contact your health care provider or a lactation consultant. OVERALL HEALTH CARE RECOMMENDATIONS WHILE BREASTFEEDING  Eat healthy foods. Alternate between meals and snacks, eating 3 of each per day. Because what you eat affects your breast milk, some of the foods may make your baby more irritable than usual. Avoid eating these foods if you are sure that they are negatively affecting your baby.  Drink milk, fruit juice, and water to satisfy your thirst (about 10 glasses a day).   Rest often, relax, and continue to take your prenatal vitamins to prevent fatigue, stress, and anemia.  Continue  breast self-awareness checks.  Avoid chewing and smoking tobacco.  Avoid alcohol and drug use. Some medicines that may be harmful to your baby can pass through breast milk. It is important to ask your health care provider before taking any medicine, including all over-the-counter and prescription medicine as well as vitamin and herbal supplements. It is possible to become pregnant while breastfeeding. If birth control is desired, ask your health care provider about options that will be safe for your baby. SEEK MEDICAL CARE IF:   You feel like you want to stop breastfeeding or have become frustrated with breastfeeding.  You have painful breasts or nipples.  Your nipples are cracked or bleeding.  Your breasts are red, tender, or warm.  You have a swollen area on either breast.  You have a fever or chills.  You have nausea or vomiting.  You have drainage other than breast milk from your nipples.  Your breasts do not become full before feedings by the 5th day after you give birth.  You feel sad and depressed.  Your baby is too sleepy to eat well.  Your baby is having trouble sleeping.   Your baby is wetting less than 3 diapers in a 24-hour period.  Your baby has less than 3 stools in a 24-hour period.  Your baby's skin or the white part of his or her eyes becomes yellow.   Your baby is not gaining weight by 5 days of age. SEEK IMMEDIATE MEDICAL CARE IF:   Your baby is overly tired (lethargic) and does not want to wake up and feed.  Your baby develops an unexplained fever. Document Released: 10/24/2005 Document Revised: 06/26/2013 Document Reviewed: 04/17/2013 [employer] Patient Information 2014 [employer].  

## 2013-11-16 ENCOUNTER — Encounter (HOSPITAL_COMMUNITY): Payer: Self-pay | Admitting: *Deleted

## 2013-11-16 ENCOUNTER — Inpatient Hospital Stay (HOSPITAL_COMMUNITY)
Admission: AD | Admit: 2013-11-16 | Discharge: 2013-11-16 | Disposition: A | Discharge status: Home / Self Care | Source: Ambulatory Visit | Attending: Obstetrics & Gynecology | Admitting: Obstetrics & Gynecology

## 2013-11-16 DIAGNOSIS — O479 False labor, unspecified: Secondary | ICD-10-CM | POA: Insufficient documentation

## 2013-11-16 NOTE — MAU Note (Signed)
Contractions for 3 days. Stronger today for last 4 hours. Denies bleeding or leaking

## 2013-11-18 ENCOUNTER — Ambulatory Visit (INDEPENDENT_AMBULATORY_CARE_PROVIDER_SITE_OTHER): Admitting: Obstetrics & Gynecology

## 2013-11-18 VITALS — BP 112/74 | Temp 98.2°F | Wt 149.0 lb

## 2013-11-18 DIAGNOSIS — Z3400 Encounter for supervision of normal first pregnancy, unspecified trimester: Secondary | ICD-10-CM

## 2013-11-18 DIAGNOSIS — Z3403 Encounter for supervision of normal first pregnancy, third trimester: Secondary | ICD-10-CM

## 2013-11-18 LAB — POCT URINALYSIS DIPSTICK
Bilirubin, UA: NEGATIVE
GLUCOSE UA: NEGATIVE
Ketones, UA: NEGATIVE
Leukocytes, UA: NEGATIVE
NITRITE UA: NEGATIVE
Protein, UA: NEGATIVE
RBC UA: NEGATIVE
Spec Grav, UA: 1.02
UROBILINOGEN UA: NEGATIVE
pH, UA: 6

## 2013-11-18 NOTE — Progress Notes (Signed)
P- 80 Patient reports doing well. Patient is still having contractions but not regular.

## 2013-11-18 NOTE — Patient Instructions (Signed)
Breastfeeding Deciding to breastfeed is one of the best choices you can make for you and your baby. A change in hormones during pregnancy causes your breast tissue to grow and increases the number and size of your milk ducts. These hormones also allow proteins, sugars, and fats from your blood supply to make breast milk in your milk-producing glands. Hormones prevent breast milk from being released before your baby is born as well as prompt milk flow after birth. Once breastfeeding has begun, thoughts of your baby, as well as his or her sucking or crying, can stimulate the release of milk from your milk-producing glands.  BENEFITS OF BREASTFEEDING For Your Baby  Your first milk (colostrum) helps your baby's digestive system function better.   There are antibodies in your milk that help your baby fight off infections.   Your baby has a lower incidence of asthma, allergies, and sudden infant death syndrome.   The nutrients in breast milk are better for your baby than infant formulas and are designed uniquely for your baby's needs.   Breast milk improves your baby's brain development.   Your baby is less likely to develop other conditions, such as childhood obesity, asthma, or type 2 diabetes mellitus.  For You   Breastfeeding helps to create a very special bond between you and your baby.   Breastfeeding is convenient. Breast milk is always available at the correct temperature and costs nothing.   Breastfeeding helps to burn calories and helps you lose the weight gained during pregnancy.   Breastfeeding makes your uterus contract to its prepregnancy size faster and slows bleeding (lochia) after you give birth.   Breastfeeding helps to lower your risk of developing type 2 diabetes mellitus, osteoporosis, and breast or ovarian cancer later in life. SIGNS THAT YOUR BABY IS HUNGRY Early Signs of Hunger  Increased alertness or activity.  Stretching.  Movement of the head from  side to side.  Movement of the head and opening of the mouth when the corner of the mouth or cheek is stroked (rooting).  Increased sucking sounds, smacking lips, cooing, sighing, or squeaking.  Hand-to-mouth movements.  Increased sucking of fingers or hands. Late Signs of Hunger  Fussing.  Intermittent crying. Extreme Signs of Hunger Signs of extreme hunger will require calming and consoling before your baby will be able to breastfeed successfully. Do not wait for the following signs of extreme hunger to occur before you initiate breastfeeding:   Restlessness.  A loud, strong cry.   Screaming. BREASTFEEDING BASICS Breastfeeding Initiation  Find a comfortable place to sit or lie down, with your neck and back well supported.  Place a pillow or rolled up blanket under your baby to bring him or her to the level of your breast (if you are seated). Nursing pillows are specially designed to help support your arms and your baby while you breastfeed.  Make sure that your baby's abdomen is facing your abdomen.   Gently massage your breast. With your fingertips, massage from your chest wall toward your nipple in a circular motion. This encourages milk flow. You may need to continue this action during the feeding if your milk flows slowly.  Support your breast with 4 fingers underneath and your thumb above your nipple. Make sure your fingers are well away from your nipple and your baby's mouth.   Stroke your baby's lips gently with your finger or nipple.   When your baby's mouth is open wide enough, quickly bring your baby to your   breast, placing your entire nipple and as much of the colored area around your nipple (areola) as possible into your baby's mouth.   More areola should be visible above your baby's upper lip than below the lower lip.   Your baby's tongue should be between his or her lower gum and your breast.   Ensure that your baby's mouth is correctly positioned  around your nipple (latched). Your baby's lips should create a seal on your breast and be turned out (everted).  It is common for your baby to suck about 2 3 minutes in order to start the flow of breast milk. Latching Teaching your baby how to latch on to your breast properly is very important. An improper latch can cause nipple pain and decreased milk supply for you and poor weight gain in your baby. Also, if your baby is not latched onto your nipple properly, he or she may swallow some air during feeding. This can make your baby fussy. Burping your baby when you switch breasts during the feeding can help to get rid of the air. However, teaching your baby to latch on properly is still the best way to prevent fussiness from swallowing air while breastfeeding. Signs that your baby has successfully latched on to your nipple:    Silent tugging or silent sucking, without causing you pain.   Swallowing heard between every 3 4 sucks.    Muscle movement above and in front of his or her ears while sucking.  Signs that your baby has not successfully latched on to nipple:   Sucking sounds or smacking sounds from your baby while breastfeeding.  Nipple pain. If you think your baby has not latched on correctly, slip your finger into the corner of your baby's mouth to break the suction and place it between your baby's gums. Attempt breastfeeding initiation again. Signs of Successful Breastfeeding Signs from your baby:   A gradual decrease in the number of sucks or complete cessation of sucking.   Falling asleep.   Relaxation of his or her body.   Retention of a small amount of milk in his or her mouth.   Letting go of your breast by himself or herself. Signs from you:  Breasts that have increased in firmness, weight, and size 1 3 hours after feeding.   Breasts that are softer immediately after breastfeeding.  Increased milk volume, as well as a change in milk consistency and color by  the 5th day of breastfeeding.   Nipples that are not sore, cracked, or bleeding. Signs That Your Baby is Getting Enough Milk  Wetting at least 3 diapers in a 24-hour period. The urine should be clear and pale yellow by age 5 days.  At least 3 stools in a 24-hour period by age 5 days. The stool should be soft and yellow.  At least 3 stools in a 24-hour period by age 7 days. The stool should be seedy and yellow.  No loss of weight greater than 10% of birth weight during the first 3 days of age.  Average weight gain of 4 7 ounces (120 210 mL) per week after age 4 days.  Consistent daily weight gain by age 5 days, without weight loss after the age of 2 weeks. After a feeding, your baby may spit up a small amount. This is common. BREASTFEEDING FREQUENCY AND DURATION Frequent feeding will help you make more milk and can prevent sore nipples and breast engorgement. Breastfeed when you feel the need to reduce   the fullness of your breasts or when your baby shows signs of hunger. This is called "breastfeeding on demand." Avoid introducing a pacifier to your baby while you are working to establish breastfeeding (the first 4 6 weeks after your baby is born). After this time you may choose to use a pacifier. Research has shown that pacifier use during the first year of a baby's life decreases the risk of sudden infant death syndrome (SIDS). Allow your baby to feed on each breast as long as he or she wants. Breastfeed until your baby is finished feeding. When your baby unlatches or falls asleep while feeding from the first breast, offer the second breast. Because newborns are often sleepy in the first few weeks of life, you may need to awaken your baby to get him or her to feed. Breastfeeding times will vary from baby to baby. However, the following rules can serve as a guide to help you ensure that your baby is properly fed:  Newborns (babies 4 weeks of age or younger) may breastfeed every 1 3  hours.  Newborns should not go longer than 3 hours during the day or 5 hours during the night without breastfeeding.  You should breastfeed your baby a minimum of 8 times in a 24-hour period until you begin to introduce solid foods to your baby at around 6 months of age. BREAST MILK PUMPING Pumping and storing breast milk allows you to ensure that your baby is exclusively fed your breast milk, even at times when you are unable to breastfeed. This is especially important if you are going back to work while you are still breastfeeding or when you are not able to be present during feedings. Your lactation consultant can give you guidelines on how long it is safe to store breast milk.  A breast pump is a machine that allows you to pump milk from your breast into a sterile bottle. The pumped breast milk can then be stored in a refrigerator or freezer. Some breast pumps are operated by hand, while others use electricity. Ask your lactation consultant which type will work best for you. Breast pumps can be purchased, but some hospitals and breastfeeding support groups lease breast pumps on a monthly basis. A lactation consultant can teach you how to hand express breast milk, if you prefer not to use a pump.  CARING FOR YOUR BREASTS WHILE YOU BREASTFEED Nipples can become dry, cracked, and sore while breastfeeding. The following recommendations can help keep your breasts moisturized and healthy:  Avoid using soap on your nipples.   Wear a supportive bra. Although not required, special nursing bras and tank tops are designed to allow access to your breasts for breastfeeding without taking off your entire bra or top. Avoid wearing underwire style bras or extremely tight bras.  Air dry your nipples for 3 4minutes after each feeding.   Use only cotton bra pads to absorb leaked breast milk. Leaking of breast milk between feedings is normal.   Use lanolin on your nipples after breastfeeding. Lanolin helps to  maintain your skin's normal moisture barrier. If you use pure lanolin you do not need to wash it off before feeding your baby again. Pure lanolin is not toxic to your baby. You may also hand express a few drops of breast milk and gently massage that milk into your nipples and allow the milk to air dry. In the first few weeks after giving birth, some women experience extremely full breasts (engorgement). Engorgement can make   your breasts feel heavy, warm, and tender to the touch. Engorgement peaks within 3 5 days after you give birth. The following recommendations can help ease engorgement:  Completely empty your breasts while breastfeeding or pumping. You may want to start by applying warm, moist heat (in the shower or with warm water-soaked hand towels) just before feeding or pumping. This increases circulation and helps the milk flow. If your baby does not completely empty your breasts while breastfeeding, pump any extra milk after he or she is finished.  Wear a snug bra (nursing or regular) or tank top for 1 2 days to signal your body to slightly decrease milk production.  Apply ice packs to your breasts, unless this is too uncomfortable for you.  Make sure that your baby is latched on and positioned properly while breastfeeding. If engorgement persists after 48 hours of following these recommendations, contact your health care provider or a lactation consultant. OVERALL HEALTH CARE RECOMMENDATIONS WHILE BREASTFEEDING  Eat healthy foods. Alternate between meals and snacks, eating 3 of each per day. Because what you eat affects your breast milk, some of the foods may make your baby more irritable than usual. Avoid eating these foods if you are sure that they are negatively affecting your baby.  Drink milk, fruit juice, and water to satisfy your thirst (about 10 glasses a day).   Rest often, relax, and continue to take your prenatal vitamins to prevent fatigue, stress, and anemia.  Continue  breast self-awareness checks.  Avoid chewing and smoking tobacco.  Avoid alcohol and drug use. Some medicines that may be harmful to your baby can pass through breast milk. It is important to ask your health care provider before taking any medicine, including all over-the-counter and prescription medicine as well as vitamin and herbal supplements. It is possible to become pregnant while breastfeeding. If birth control is desired, ask your health care provider about options that will be safe for your baby. SEEK MEDICAL CARE IF:   You feel like you want to stop breastfeeding or have become frustrated with breastfeeding.  You have painful breasts or nipples.  Your nipples are cracked or bleeding.  Your breasts are red, tender, or warm.  You have a swollen area on either breast.  You have a fever or chills.  You have nausea or vomiting.  You have drainage other than breast milk from your nipples.  Your breasts do not become full before feedings by the 5th day after you give birth.  You feel sad and depressed.  Your baby is too sleepy to eat well.  Your baby is having trouble sleeping.   Your baby is wetting less than 3 diapers in a 24-hour period.  Your baby has less than 3 stools in a 24-hour period.  Your baby's skin or the white part of his or her eyes becomes yellow.   Your baby is not gaining weight by 5 days of age. SEEK IMMEDIATE MEDICAL CARE IF:   Your baby is overly tired (lethargic) and does not want to wake up and feed.  Your baby develops an unexplained fever. Document Released: 10/24/2005 Document Revised: 06/26/2013 Document Reviewed: 04/17/2013 [employer] Patient Information 2014 [employer].  

## 2013-11-20 ENCOUNTER — Encounter (HOSPITAL_COMMUNITY): Payer: Self-pay | Admitting: Family Medicine

## 2013-11-20 ENCOUNTER — Inpatient Hospital Stay (HOSPITAL_COMMUNITY)
Admission: AD | Admit: 2013-11-20 | Discharge: 2013-11-22 | DRG: 775 | Disposition: A | Discharge status: Home / Self Care | Source: Ambulatory Visit | Attending: Obstetrics & Gynecology | Admitting: Obstetrics & Gynecology

## 2013-11-20 ENCOUNTER — Inpatient Hospital Stay (HOSPITAL_COMMUNITY): Admitting: Anesthesiology

## 2013-11-20 ENCOUNTER — Encounter (HOSPITAL_COMMUNITY): Admitting: Anesthesiology

## 2013-11-20 DIAGNOSIS — IMO0001 Reserved for inherently not codable concepts without codable children: Secondary | ICD-10-CM

## 2013-11-20 DIAGNOSIS — O26839 Pregnancy related renal disease, unspecified trimester: Secondary | ICD-10-CM | POA: Diagnosis present

## 2013-11-20 DIAGNOSIS — N189 Chronic kidney disease, unspecified: Secondary | ICD-10-CM | POA: Diagnosis present

## 2013-11-20 LAB — CBC
HEMATOCRIT: 36 % (ref 36.0–46.0)
Hemoglobin: 12.4 g/dL (ref 12.0–15.0)
MCH: 30.5 pg (ref 26.0–34.0)
MCHC: 34.4 g/dL (ref 30.0–36.0)
MCV: 88.7 fL (ref 78.0–100.0)
PLATELETS: 257 10*3/uL (ref 150–400)
RBC: 4.06 MIL/uL (ref 3.87–5.11)
RDW: 12.6 % (ref 11.5–15.5)
WBC: 15.4 10*3/uL — ABNORMAL HIGH (ref 4.0–10.5)

## 2013-11-20 LAB — RPR: RPR: NONREACTIVE

## 2013-11-20 MED ORDER — FLEET ENEMA 7-19 GM/118ML RE ENEM: 1.0000 | ENEMA | RECTAL | Status: DC | PRN

## 2013-11-20 MED ORDER — MISOPROSTOL 25 MCG QUARTER TABLET
75.0000 ug | ORAL_TABLET | Freq: Once | ORAL | Status: AC
  Administered 2013-11-20: 75 ug via ORAL
  Filled 2013-11-20: qty 0.75

## 2013-11-20 MED ORDER — LIDOCAINE HCL (PF) 1 % IJ SOLN
30.0000 mL | INTRAMUSCULAR | Status: AC | PRN
  Administered 2013-11-20: 30 mL via SUBCUTANEOUS
  Filled 2013-11-20 (×2): qty 30

## 2013-11-20 MED ORDER — IBUPROFEN 600 MG PO TABS: 600.0000 mg | ORAL_TABLET | Freq: Four times a day (QID) | ORAL | Status: DC | PRN

## 2013-11-20 MED ORDER — CITRIC ACID-SODIUM CITRATE 334-500 MG/5ML PO SOLN
30.0000 mL | ORAL | Status: DC | PRN
  Administered 2013-11-20: 30 mL via ORAL
  Filled 2013-11-20: qty 15

## 2013-11-20 MED ORDER — MEDROXYPROGESTERONE ACETATE 150 MG/ML IM SUSP: 150.0000 mg | INTRAMUSCULAR | Status: DC | PRN

## 2013-11-20 MED ORDER — OXYCODONE-ACETAMINOPHEN 5-325 MG PO TABS: 1.0000 | ORAL_TABLET | ORAL | Status: DC | PRN

## 2013-11-20 MED ORDER — BENZOCAINE-MENTHOL 20-0.5 % EX AERO
1.0000 | INHALATION_SPRAY | CUTANEOUS | Status: DC | PRN
Start: 2013-11-20 — End: 2013-11-23
  Administered 2013-11-20: 1 via TOPICAL
  Filled 2013-11-20: qty 56

## 2013-11-20 MED ORDER — LACTATED RINGERS IV SOLN: 500.0000 mL | INTRAVENOUS | Status: DC | PRN

## 2013-11-20 MED ORDER — TETANUS-DIPHTH-ACELL PERTUSSIS 5-2.5-18.5 LF-MCG/0.5 IM SUSP: 0.5000 mL | Freq: Once | INTRAMUSCULAR | Status: DC

## 2013-11-20 MED ORDER — PRENATAL MULTIVITAMIN CH
1.0000 | ORAL_TABLET | Freq: Every day | ORAL | Status: DC
  Administered 2013-11-21: 1 via ORAL
  Filled 2013-11-20: qty 1

## 2013-11-20 MED ORDER — EPHEDRINE 5 MG/ML INJ
10.0000 mg | INTRAVENOUS | Status: DC | PRN
  Filled 2013-11-20: qty 2

## 2013-11-20 MED ORDER — DIPHENHYDRAMINE HCL 25 MG PO CAPS: 25.0000 mg | ORAL_CAPSULE | Freq: Four times a day (QID) | ORAL | Status: DC | PRN

## 2013-11-20 MED ORDER — DIPHENHYDRAMINE HCL 50 MG/ML IJ SOLN: 12.5000 mg | INTRAMUSCULAR | Status: DC | PRN

## 2013-11-20 MED ORDER — EPHEDRINE 5 MG/ML INJ
10.0000 mg | INTRAVENOUS | Status: DC | PRN
  Filled 2013-11-20: qty 2
  Filled 2013-11-20: qty 4

## 2013-11-20 MED ORDER — LACTATED RINGERS IV SOLN
INTRAVENOUS | Status: DC
  Administered 2013-11-20 (×2): 125 mL/h via INTRAVENOUS

## 2013-11-20 MED ORDER — SENNOSIDES-DOCUSATE SODIUM 8.6-50 MG PO TABS
2.0000 | ORAL_TABLET | ORAL | Status: DC
  Administered 2013-11-21 (×2): 2 via ORAL
  Filled 2013-11-20 (×2): qty 2

## 2013-11-20 MED ORDER — OXYTOCIN 40 UNITS IN LACTATED RINGERS INFUSION - SIMPLE MED
62.5000 mL/h | INTRAVENOUS | Status: DC
  Administered 2013-11-20: 62.5 mL/h via INTRAVENOUS
  Filled 2013-11-20: qty 1000

## 2013-11-20 MED ORDER — FENTANYL 2.5 MCG/ML BUPIVACAINE 1/10 % EPIDURAL INFUSION (WH - ANES)
14.0000 mL/h | INTRAMUSCULAR | Status: DC | PRN
  Administered 2013-11-20 (×2): 14 mL/h via EPIDURAL
  Filled 2013-11-20 (×2): qty 125

## 2013-11-20 MED ORDER — IBUPROFEN 600 MG PO TABS
600.0000 mg | ORAL_TABLET | Freq: Four times a day (QID) | ORAL | Status: DC
  Administered 2013-11-20 – 2013-11-22 (×7): 600 mg via ORAL
  Filled 2013-11-20 (×7): qty 1

## 2013-11-20 MED ORDER — ONDANSETRON HCL 4 MG/2ML IJ SOLN
4.0000 mg | INTRAMUSCULAR | Status: DC | PRN
Start: 2013-11-20 — End: 2013-11-23

## 2013-11-20 MED ORDER — MAGNESIUM HYDROXIDE 400 MG/5ML PO SUSP
30.0000 mL | ORAL | Status: DC | PRN
Start: 2013-11-20 — End: 2013-11-23

## 2013-11-20 MED ORDER — ZOLPIDEM TARTRATE 5 MG PO TABS: 5.0000 mg | ORAL_TABLET | Freq: Every evening | ORAL | Status: DC | PRN

## 2013-11-20 MED ORDER — DIBUCAINE 1 % RE OINT: 1.0000 "application " | TOPICAL_OINTMENT | RECTAL | Status: DC | PRN

## 2013-11-20 MED ORDER — ACETAMINOPHEN 325 MG PO TABS: 650.0000 mg | ORAL_TABLET | ORAL | Status: DC | PRN

## 2013-11-20 MED ORDER — MEASLES, MUMPS & RUBELLA VAC ~~LOC~~ INJ
0.5000 mL | INJECTION | Freq: Once | SUBCUTANEOUS | Status: DC
  Filled 2013-11-20: qty 0.5

## 2013-11-20 MED ORDER — OXYTOCIN BOLUS FROM INFUSION: 500.0000 mL | INTRAVENOUS | Status: DC

## 2013-11-20 MED ORDER — FERROUS SULFATE 325 (65 FE) MG PO TABS
325.0000 mg | ORAL_TABLET | Freq: Two times a day (BID) | ORAL | Status: DC
  Administered 2013-11-20 – 2013-11-21 (×3): 325 mg via ORAL
  Filled 2013-11-20 (×3): qty 1

## 2013-11-20 MED ORDER — OXYCODONE-ACETAMINOPHEN 5-325 MG PO TABS
1.0000 | ORAL_TABLET | ORAL | Status: DC | PRN
  Administered 2013-11-21: 1 via ORAL
  Filled 2013-11-20: qty 1

## 2013-11-20 MED ORDER — WITCH HAZEL-GLYCERIN EX PADS
1.0000 "application " | MEDICATED_PAD | CUTANEOUS | Status: DC | PRN
  Administered 2013-11-20: 1 via TOPICAL

## 2013-11-20 MED ORDER — LACTATED RINGERS IV SOLN
500.0000 mL | Freq: Once | INTRAVENOUS | Status: AC
  Administered 2013-11-20: 500 mL via INTRAVENOUS

## 2013-11-20 MED ORDER — LIDOCAINE HCL (PF) 1 % IJ SOLN
INTRAMUSCULAR | Status: DC | PRN
  Administered 2013-11-20 (×4): 4 mL

## 2013-11-20 MED ORDER — LANOLIN HYDROUS EX OINT
TOPICAL_OINTMENT | CUTANEOUS | Status: DC | PRN
Start: 2013-11-20 — End: 2013-11-23

## 2013-11-20 MED ORDER — PHENYLEPHRINE 40 MCG/ML (10ML) SYRINGE FOR IV PUSH (FOR BLOOD PRESSURE SUPPORT)
80.0000 ug | PREFILLED_SYRINGE | INTRAVENOUS | Status: DC | PRN
  Filled 2013-11-20: qty 10
  Filled 2013-11-20: qty 2

## 2013-11-20 MED ORDER — PHENYLEPHRINE 40 MCG/ML (10ML) SYRINGE FOR IV PUSH (FOR BLOOD PRESSURE SUPPORT)
80.0000 ug | PREFILLED_SYRINGE | INTRAVENOUS | Status: DC | PRN
  Filled 2013-11-20: qty 2

## 2013-11-20 MED ORDER — ONDANSETRON HCL 4 MG/2ML IJ SOLN: 4.0000 mg | Freq: Four times a day (QID) | INTRAMUSCULAR | Status: DC | PRN

## 2013-11-20 MED ORDER — ONDANSETRON HCL 4 MG PO TABS: 4.0000 mg | ORAL_TABLET | ORAL | Status: DC | PRN

## 2013-11-20 NOTE — Anesthesia Preprocedure Evaluation (Signed)
Anesthesia Evaluation  Patient identified by MRN, date of birth, ID band Patient awake    Reviewed: Allergy & Precautions, H&P , NPO status , Patient's Chart, lab work & pertinent test results, reviewed documented beta blocker date and time   History of Anesthesia Complications Negative for: history of anesthetic complications  Airway Mallampati: II TM Distance: >3 FB Neck ROM: full    Dental  (+) Teeth Intact   Pulmonary neg pulmonary ROS,  breath sounds clear to auscultation        Cardiovascular negative cardio ROS  Rhythm:regular Rate:Normal     Neuro/Psych negative neurological ROS  negative psych ROS   GI/Hepatic negative GI ROS, Neg liver ROS,   Endo/Other  negative endocrine ROS  Renal/GU Renal disease (has only one functioning kidney.  normal Cr (0.69))     Musculoskeletal   Abdominal   Peds  Hematology negative hematology ROS (+)   Anesthesia Other Findings   Reproductive/Obstetrics (+) Pregnancy                           Anesthesia Physical Anesthesia Plan  ASA: II  Anesthesia Plan: Epidural   Post-op Pain Management:    Induction:   Airway Management Planned:   Additional Equipment:   Intra-op Plan:   Post-operative Plan:   Informed Consent: I have reviewed the patients History and Physical, chart, labs and discussed the procedure including the risks, benefits and alternatives for the proposed anesthesia with the patient or authorized representative who has indicated his/her understanding and acceptance.     Plan Discussed with:   Anesthesia Plan Comments:         Anesthesia Quick Evaluation

## 2013-11-20 NOTE — MAU Note (Signed)
PT ARRIVED  IN LOBBY - UNCOMFORTABLE.  SAYS VE ON SAT WAS 2 CM.  GBS- NEG.  DENIES HSV AND MRSA.   INTACT

## 2013-11-20 NOTE — Anesthesia Procedure Notes (Signed)
Epidural Patient location during procedure: OB Start time: 11/20/2013 4:48 AM  Staffing Performed by: anesthesiologist   Preanesthetic Checklist Completed: patient identified, site marked, surgical consent, pre-op evaluation, timeout performed, IV checked, risks and benefits discussed and monitors and equipment checked  Epidural Patient position: sitting Prep: site prepped and draped and DuraPrep Patient monitoring: continuous pulse ox and blood pressure Approach: midline Injection technique: LOR air  Needle:  Needle type: Tuohy  Needle gauge: 17 G Needle length: 9 cm and 9 Needle insertion depth: 5 cm cm Catheter type: closed end flexible Catheter size: 19 Gauge Catheter at skin depth: 10 cm Test dose: negative  Assessment Events: blood not aspirated, injection not painful, no injection resistance, negative IV test and no paresthesia  Additional Notes Discussed risk of headache, infection, bleeding, nerve injury and failed or incomplete block.  Patient voices understanding and wishes to proceed.  Epidural placed easily on first attempt.  No paresthesia.  Patient tolerated procedure well with no apparent complications.  Jasmine DecemberA. Kathlynn Swofford, MDReason for block:procedure for pain

## 2013-11-20 NOTE — H&P (Signed)
Diane Thompson is a 23 y.o. female presenting for UC's. Maternal Medical History:  Reason for admission: Contractions.  23 yo G1.  EDC 12-01-13.  Presents with UC's.  Fetal activity: Perceived fetal activity is normal.   Last perceived fetal movement was within the past hour.    Prenatal complications: no prenatal complications Prenatal Complications - Diabetes: none.    OB History   Grav Para Term Preterm Abortions TAB SAB Ect Mult Living   1              Past Medical History  Diagnosis Date  . Chronic kidney disease     Only left kidney function   History reviewed. No pertinent past surgical history. Family History: family history is not on file. Social History:  reports that she has never smoked. She does not have any smokeless tobacco history on file. She reports that she does not drink alcohol or use illicit drugs.   Prenatal Transfer Tool  Maternal Diabetes: No Genetic Screening: Normal Maternal Ultrasounds/Referrals: Normal Fetal Ultrasounds or other Referrals:  None Maternal Substance Abuse:  No Significant Maternal Medications:  None Significant Maternal Lab Results:  None Other Comments:  None  Review of Systems  All other systems reviewed and are negative.    Dilation: 5.5 Effacement (%): 80 Station: -1 Exam by:: DCALLAWAY, RN Blood pressure 121/70, pulse 80, temperature 98.2 F (36.8 C), temperature source Oral, resp. rate 16, height 5\' 4"  (1.626 m), weight 149 lb (67.586 kg), last menstrual period 02/24/2013, SpO2 100.00%. Maternal Exam:  Abdomen: Patient reports no abdominal tenderness. Fetal presentation: vertex  Introitus: Normal vulva. Normal vagina.    Physical Exam  Nursing note and vitals reviewed. Constitutional: She is oriented to person, place, and time. She appears well-developed and well-nourished.  HENT:  Head: Normocephalic and atraumatic.  Eyes: Conjunctivae are normal. Pupils are equal, round, and reactive to light.  Neck:  Normal range of motion. Neck supple.  Cardiovascular: Normal rate and regular rhythm.   Respiratory: Effort normal and breath sounds normal.  GI: Soft.  Genitourinary: Vagina normal and uterus normal.  Musculoskeletal: Normal range of motion.  Neurological: She is alert and oriented to person, place, and time.  Skin: Skin is warm and dry.  Psychiatric: She has a normal mood and affect. Her behavior is normal. Judgment and thought content normal.    Prenatal labs: ABO, Rh: O/POS/-- (07/14 1401) Antibody: NEG (07/14 1401) Rubella: 1.99 (07/14 1401) RPR: NON REAC (10/09 1047)  HBsAg: NEGATIVE (07/14 1401)  HIV: NON REACTIVE (10/09 1047)  GBS: Negative (12/18 0000)   Assessment/Plan: 38.3 weeks.  Active labor.  Admit.   Diane Thompson A 11/20/2013, 5:48 AM

## 2013-11-21 NOTE — Lactation Note (Signed)
This note was copied from the chart of Boy Dwaine GaleLindsey Mckendree. Lactation Consultation Note  Patient Name: Boy Dwaine GaleLindsey Hatcher ZOXWR'UToday's Date: 11/21/2013 Reason for consult: Initial assessment of this primipara and her newborn at 4232 hours of age.  Baby has been exclusively breastfeeding and mom reports just finishing a 30 minute feeding but was concerned that baby spit up some colostrum after feeding.  Baby asleep and STS in upright position now.  LC discussed normal spitting up, especially during early days of feeding but encouraged upright positioning either during or after feedings and offering baby an opportunity to burp.  Baby has had copious output, with 4 voids and 5 stools in 32 hours.  Feedings are 10-30 minutes each. LC encouraged review of Baby and Me pp 9, 14 and 20-25 for STS and BF information. LC provided [facility] Resource brochure and reviewed [facility] services and list of community and web site resources. LC encouraged STS and cue feedings.     Maternal Data Formula Feeding for Exclusion: No Infant to breast within first hour of birth: Yes Has patient been taught Hand Expression?:  (not documented) Does the patient have breastfeeding experience prior to this delivery?: No  Feeding Feeding Type: Breast Fed Length of feed: 30 min  LATCH Score/Interventions         Most recent LATCH score=9, per RN assessment             Lactation Tools Discussed/Used   STS, cue feedings Positioning and burping  Consult Status   LC to follow-up tomorrow   Lynda RainwaterBryant, Arvind Mexicano [place] 11/21/2013, 10:48 PM

## 2013-11-21 NOTE — Anesthesia Postprocedure Evaluation (Signed)
Anesthesia Post Note  Patient: Diane GaleLindsey Thompson  Procedure(s) Performed: * No procedures listed *  Anesthesia type: Epidural  Patient location: Mother/Baby  Post pain: Pain level controlled  Post assessment: Post-op Vital signs reviewed  Last Vitals:  Filed Vitals:   11/21/13 0640  BP: 121/77  Pulse: 80  Temp: 36.8 C  Resp: 18    Post vital signs: Reviewed  Level of consciousness:alert  Complications: No apparent anesthesia complications

## 2013-11-21 NOTE — Progress Notes (Signed)
Patient ID: Diane Thompson, female   DOB: [birth year], 23 y.o.   MRN: [id number] Post Partum Day 1 S/P spontaneous vaginal RH status/Rubella reviewed.  Feeding: breast Subjective: No HA, SOB, CP, F/C, breast symptoms. Normal vaginal bleeding, no clots.     Objective: BP 121/77  Pulse 80  Temp(Src) 98.2 F (36.8 C) (Oral)  Resp 18  Ht 5\' 4"  (1.626 m)  Wt 67.586 kg (149 lb)  BMI 25.56 kg/m2  SpO2 100%  LMP 02/24/2013   Physical Exam:  General: alert Lochia: appropriate Uterine Fundus: firm DVT Evaluation: No evidence of DVT seen on physical exam. Ext: No c/c/e  Recent Labs  11/20/13 0400  HGB 12.4  HCT 36.0      Assessment/Plan: 23 y.o.  PPD #1 .  normal postpartum exam Continue current postpartum care Ambulate   LOS: 1 day   JACKSON-MOORE,Carrell Palmatier A 11/21/2013, 8:43 AM

## 2013-11-22 MED ORDER — IBUPROFEN 600 MG PO TABS: 600.0000 mg | ORAL_TABLET | Freq: Four times a day (QID) | ORAL | Status: AC | PRN

## 2013-11-22 MED ORDER — OXYCODONE-ACETAMINOPHEN 5-325 MG PO TABS: 1.0000 | ORAL_TABLET | ORAL | Status: DC | PRN

## 2013-11-22 NOTE — Discharge Summary (Signed)
Obstetric Discharge Summary Reason for Admission: induction of labor Prenatal Procedures: NST Intrapartum Procedures: spontaneous vaginal delivery Postpartum Procedures: none Complications-Operative and Postpartum: none Hemoglobin  Date Value Range Status  11/20/2013 12.4  12.0 - 15.0 g/dL Final     HCT  Date Value Range Status  11/20/2013 36.0  36.0 - 46.0 % Final    Physical Exam:  General: alert and no distress Lochia: appropriate Uterine Fundus: firm Incision: healing well DVT Evaluation: No evidence of DVT seen on physical exam.  Discharge Diagnoses: Term Pregnancy-delivered  Discharge Information: Date: 11/22/2013 Activity: pelvic rest Diet: routine Medications: PNV, Ibuprofen, Colace and Percocet Condition: stable Instructions: refer to practice specific booklet Discharge to: home Follow-up Information   Follow up with Antionette CharJACKSON-MOORE,LISA A, MD. Schedule an appointment as soon as possible for a visit in 2 weeks.   Specialty:  Obstetrics and Gynecology   Contact information:   [address] [place] [id number] [phone]       Newborn Data: Live born female  Birth Weight: 6 lb 14.5 oz (3133 g) APGAR: 9, 9  Home with mother.  Diane Thompson A 11/22/2013, 5:24 AM

## 2013-11-22 NOTE — Discharge Instructions (Signed)
Before Baby Comes Home °Ask any questions about feeding, diapering, and baby care before you leave the hospital. Ask again if you do not understand. Ask when you need to see the doctor again. °There are several things you must have before your baby comes home. °· Infant car seat. °· Crib. °· Do not let your baby sleep in a bed with you or anyone else. °· If you do not have a bed for your baby, ask the doctor what you can use that will be safe for the baby to sleep in. °Infant feeding supplies: °· 6 to 8 bottles (8 oz. size). °· 6 to 8 nipples. °· Measuring cup. °· Measuring tablespoon. °· Bottle brush. °· Sterilizer (or use any large pan or kettle with a lid). °· Formula that contains iron. °· A way to boil and cool water. °Breastfeeding supplies: °· Breast pump. °· Nipple cream. °Clothing: °· 24 to 36 cloth diapers and waterproof diaper covers or a box of disposable diapers. You may need as many as 10 to 12 diapers per day. °· 3 onesies (other clothing will depend on the time of year and the weather). °· 3 receiving blankets. °· 3 baby pajamas or gowns. °· 3 bibs. °Bath equipment: °· Mild soap. °· Petroleum jelly. No baby oil or powder. °· Soft cloth towel and wash cloth. °· Cotton balls. °· Separate bath basin for baby. Only sponge bathe until umbilical cord and circumcision are healed. °Other supplies: °· Thermometer and bulb syringe (ask the hospital to send them home with you). Ask your doctor about how you should take your baby's temperature. °· One to two pacifiers. °Prepare for an emergency: °· Know how to get to the hospital and know where to admit your baby. °· Put all doctor numbers near your house phone and in your cell phone if you have one. °Prepare your family: °· Talk with siblings about the baby coming home and how they feel about it. °· Decide how you want to handle visitors and other family members. °· Take offers for help with the baby. You will need time to adjust. °Know when to call the doctor.   °GET HELP RIGHT AWAY IF: °· Your baby's temperature is greater than 100.4° F (38° C). °· The softspot on your baby's head starts to bulge. °· Your baby is crying with no tears or has no wet diapers for 6 hours. °· Your baby has rapid breathing. °· Your baby is not as alert. °Document Released: 10/06/2008 Document Revised: 01/16/2012 Document Reviewed: 01/13/2011 °Exit[employer]® Patient Information ©2014 [employer]. ° °

## 2013-11-22 NOTE — Progress Notes (Signed)
Post Partum Day 2 Subjective: no complaints  Objective: Blood pressure 121/78, pulse 77, temperature 98.5 F (36.9 C), temperature source Oral, resp. rate 18, height 5\' 4"  (1.626 m), weight 149 lb (67.586 kg), last menstrual period 02/24/2013, SpO2 100.00%, unknown if currently breastfeeding.  Physical Exam:  General: alert and no distress Lochia: appropriate Uterine Fundus: firm Incision: healing well DVT Evaluation: No evidence of DVT seen on physical exam.   Recent Labs  11/20/13 0400  HGB 12.4  HCT 36.0    Assessment/Plan: Discharge home   LOS: 2 days   Diane Thompson A 11/22/2013, 5:18 AM

## 2013-11-27 ENCOUNTER — Encounter: Admitting: Obstetrics & Gynecology

## 2013-12-04 ENCOUNTER — Ambulatory Visit (INDEPENDENT_AMBULATORY_CARE_PROVIDER_SITE_OTHER): Admitting: Obstetrics & Gynecology

## 2013-12-04 ENCOUNTER — Encounter: Payer: Self-pay | Admitting: Obstetrics & Gynecology

## 2013-12-04 VITALS — BP 123/81 | HR 86 | Temp 99.6°F | Ht 64.0 in | Wt 128.0 lb

## 2013-12-04 DIAGNOSIS — R6889 Other general symptoms and signs: Secondary | ICD-10-CM

## 2013-12-04 DIAGNOSIS — IMO0002 Reserved for concepts with insufficient information to code with codable children: Secondary | ICD-10-CM

## 2013-12-04 NOTE — Progress Notes (Signed)
Subjective:     Diane Thompson is a 23 y.o. female who presents for a postpartum visit. She is 2 weeks postpartum following a spontaneous vaginal delivery. I have fully reviewed the prenatal and intrapartum course. The delivery was at 38 gestational weeks. Outcome: spontaneous vaginal delivery. Anesthesia: epidural. Postpartum course has been WNL. Baby's course has been WNL. Baby is feeding by breast. Bleeding staining only. Bowel function is normal. Bladder function is normal. Patient is not sexually active. Contraception method is abstinence. Postpartum depression screening: negative.  The following portions of the patient's history were reviewed and updated as appropriate: allergies, current medications, past family history, past medical history, past social history, past surgical history and problem list.  Review of Systems Pertinent items are noted in HPI.   Objective:    BP 123/81  Pulse 86  Temp(Src) 99.6 F (37.6 C)  Ht 5\' 4"  (1.626 m)  Wt 128 lb (58.06 kg)  BMI 21.96 kg/m2  Breastfeeding? Yes        Assessment:     Doing well postpartum  Plan:    Contraception: abstinence  Follow up in: 1 month or as needed.

## 2014-01-01 ENCOUNTER — Encounter: Payer: Self-pay | Admitting: Obstetrics & Gynecology

## 2014-01-01 ENCOUNTER — Ambulatory Visit (INDEPENDENT_AMBULATORY_CARE_PROVIDER_SITE_OTHER): Admitting: Obstetrics & Gynecology

## 2014-01-01 NOTE — Progress Notes (Signed)
Subjective:     Diane Thompson is a 23 y.o. female who presents for a postpartum visit. She is 6 weeks postpartum following a spontaneous vaginal delivery. I have fully reviewed the prenatal and intrapartum course. The delivery was at 38 gestational weeks. Outcome: spontaneous vaginal delivery. Anesthesia: epidural. Postpartum course has been WNL. Baby's course has been WNL. Baby is feeding by breast. Bleeding staining only. Bowel function is normal. Bladder function is normal. Patient is not sexually active. Contraception method is abstinence. Postpartum depression screening: negative. Patient denies any concerns.   The following portions of the patient's history were reviewed and updated as appropriate: allergies, current medications, past family history, past medical history, past social history, past surgical history and problem list.  Review of Systems Genitourinary:negative  post surgical exam normal. Vaginal cuff healing well without bleeding.  Objective:    BP 117/81  Pulse 92  Temp(Src) 97.3 F (36.3 C)  Ht 5\' 4"  (1.626 m)  Wt 125 lb (56.7 kg)  BMI 21.45 kg/m2  Breastfeeding? Yes        General:  alert     Abdomen: soft, non-tender; bowel sounds normal; no masses,  no organomegaly   Vulva:  normal  Vagina: normal vagina  Cervix:  no lesions  Corpus: normal size, contour, position, consistency, mobility, non-tender  Adnexa:  normal adnexa   Assessment:    Normal postpartum exam. Pap smear not done at today's visit.   Plan:  Plans progesterone-only pill Follow up in: 2 months or as needed.

## 2014-01-07 ENCOUNTER — Encounter: Payer: Self-pay | Admitting: Obstetrics & Gynecology

## 2014-01-29 ENCOUNTER — Ambulatory Visit: Admitting: Obstetrics & Gynecology

## 2014-01-31 ENCOUNTER — Ambulatory Visit: Admitting: Obstetrics & Gynecology

## 2014-06-21 IMAGING — US US OB DETAIL+14 WK
1 series · 14 of 28 positions shown · non-contrast
Comparison: none

[Series 1: us ob detail+14 wk · 0.23mm/px · 14 of 70 slices shown]
[im 3/70]
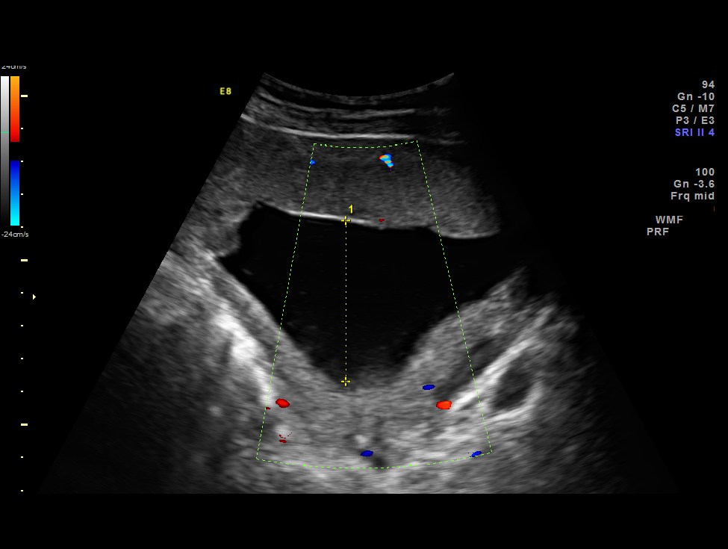
[im 8/70]
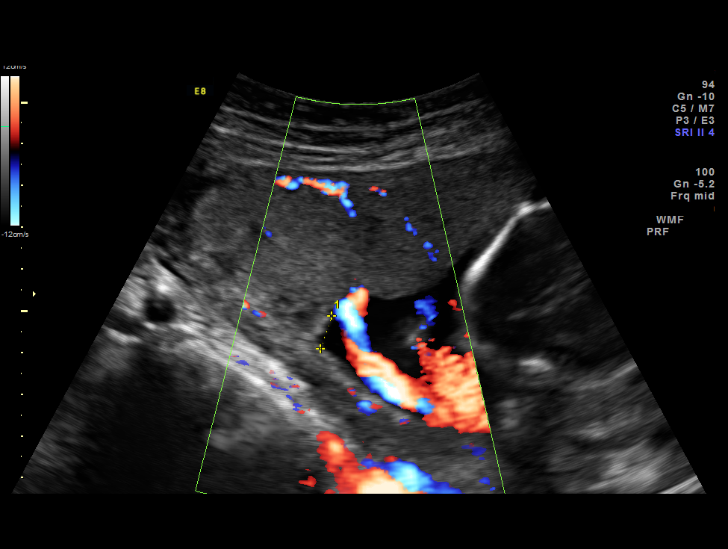
[im 13/70]
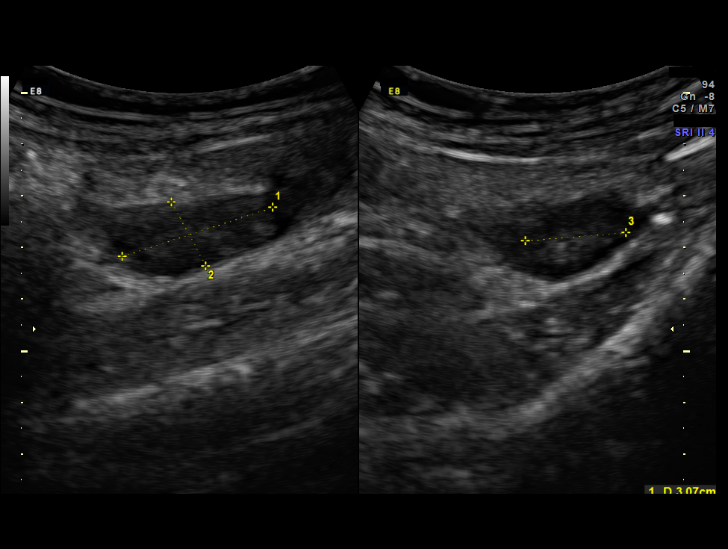
[im 18/70]
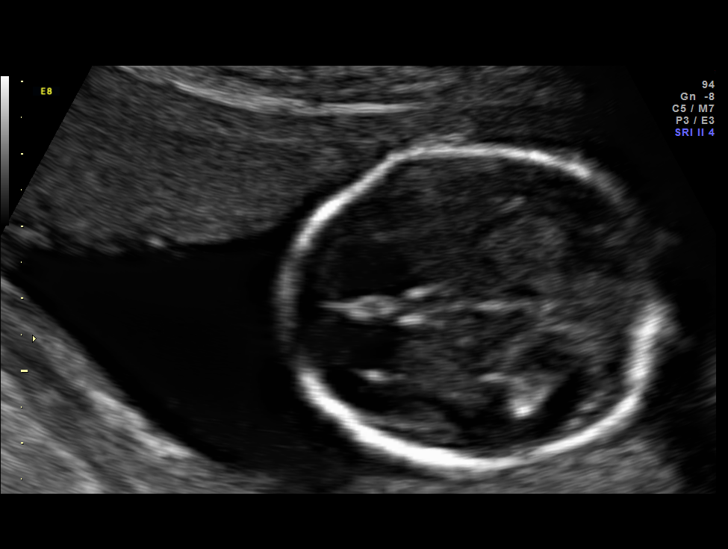
[im 24/70]
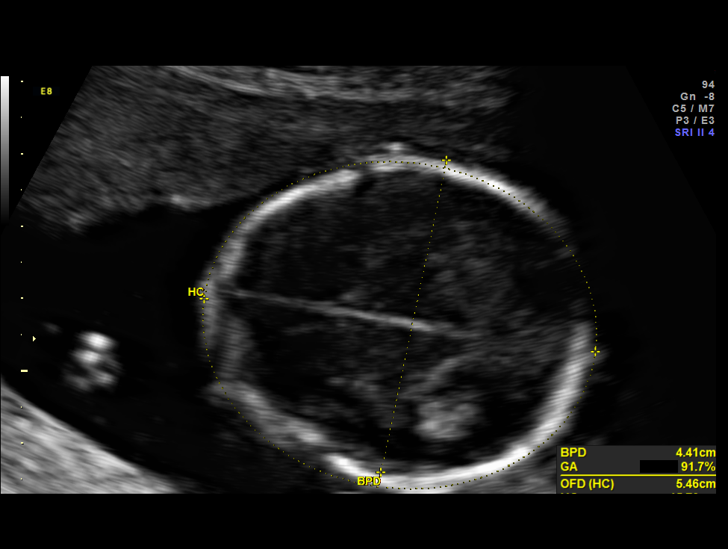
[im 29/70]
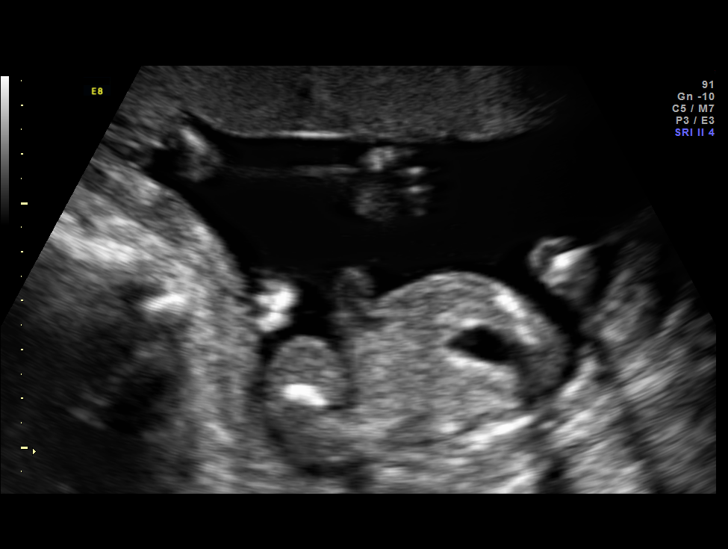
[im 34/70]
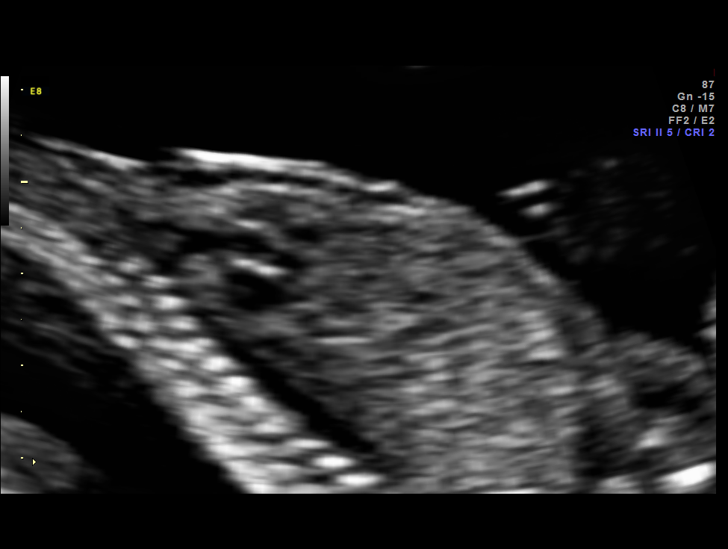
[im 39/70]
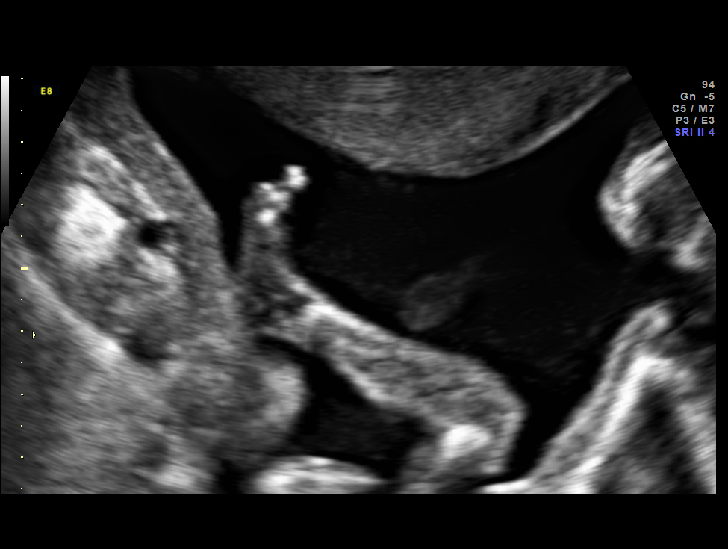
[im 44/70]
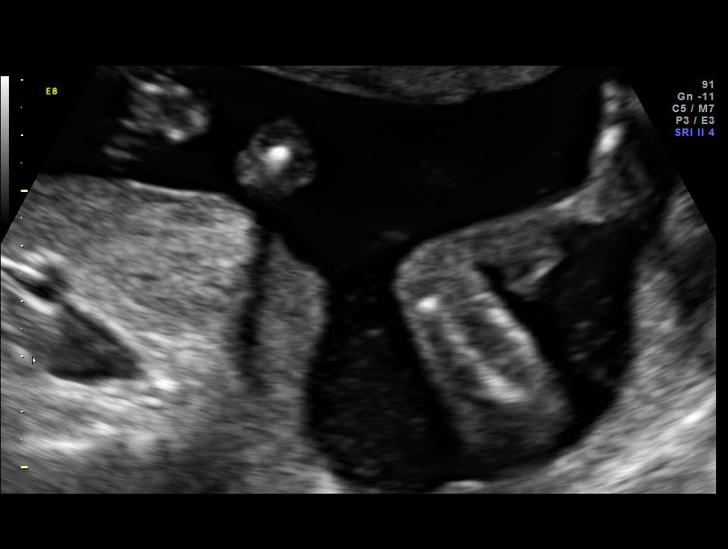
[im 49/70]
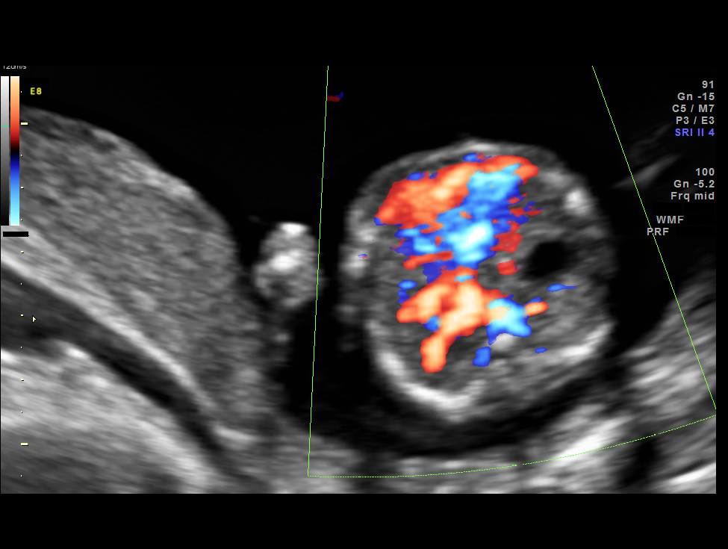
[im 54/70]
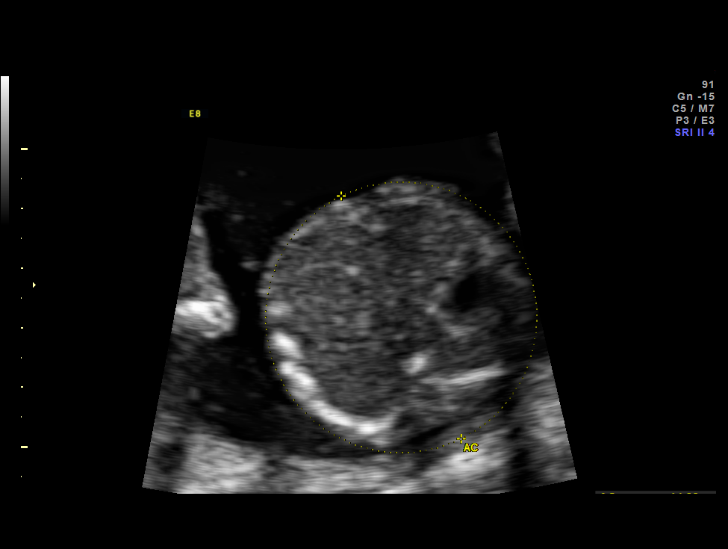
[im 59/70]
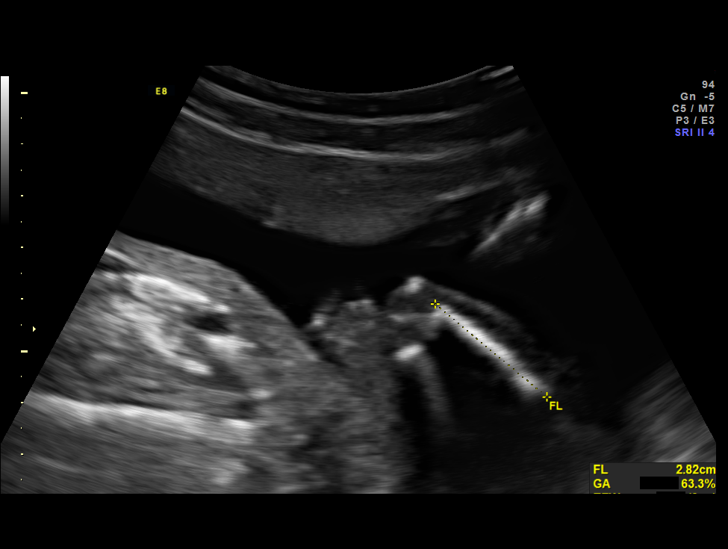
[im 64/70]
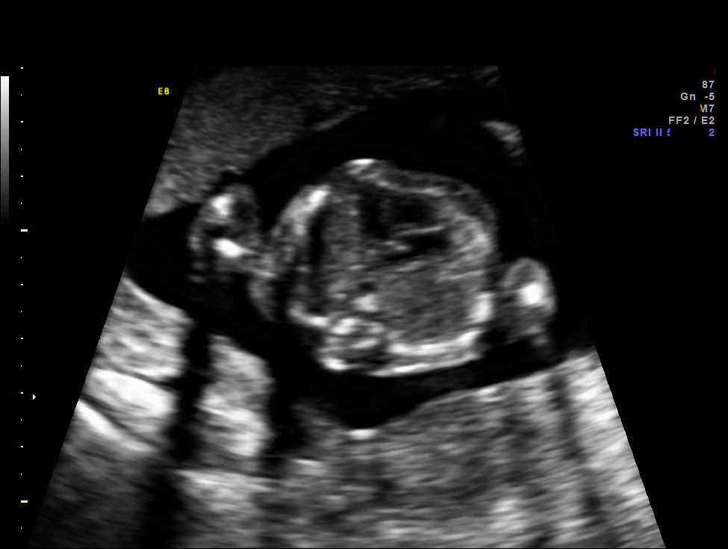
[im 70/70]
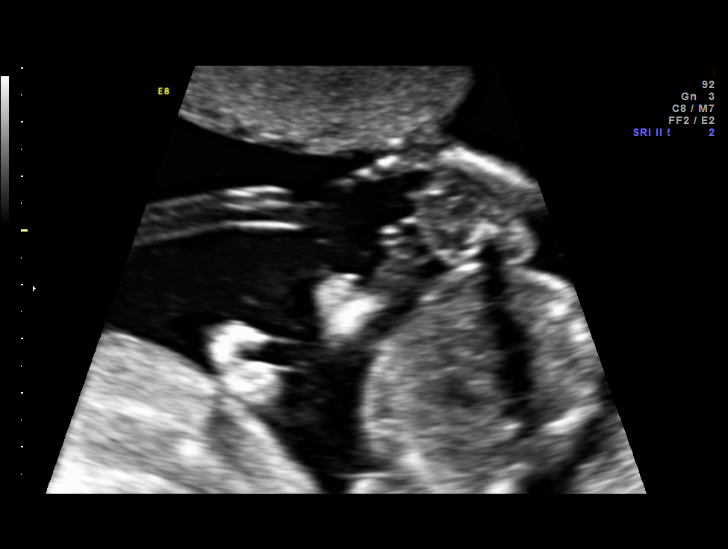

[14 of 28 positions shown; findings below may reference images not displayed]

Canned report from images found in remote index.

Refer to host system for actual result text.

## 2014-07-19 IMAGING — US US OB FOLLOW-UP
1 series · 12 of 28 positions shown · non-contrast
Comparison: none

[Series 1: us ob follow-up · 12 of 52 slices shown]
[im 2/52]
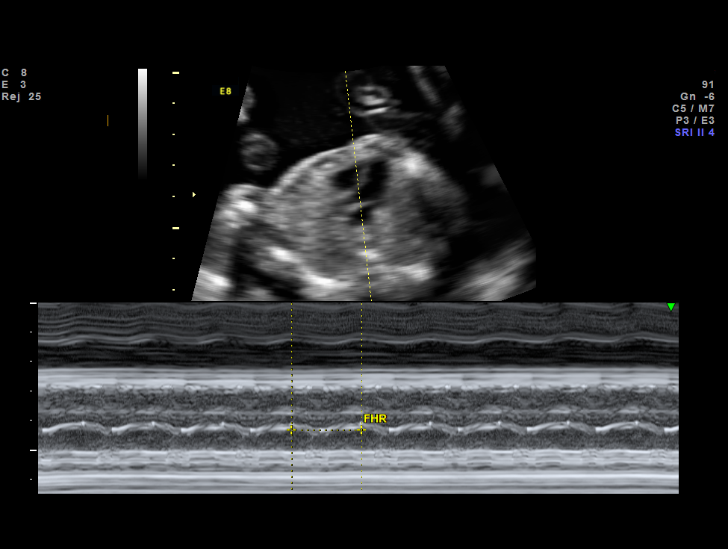
[im 6/52]
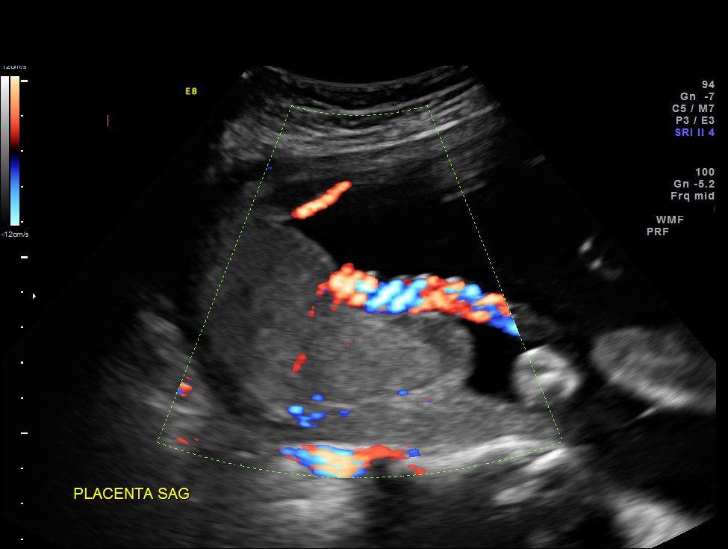
[im 10/52]
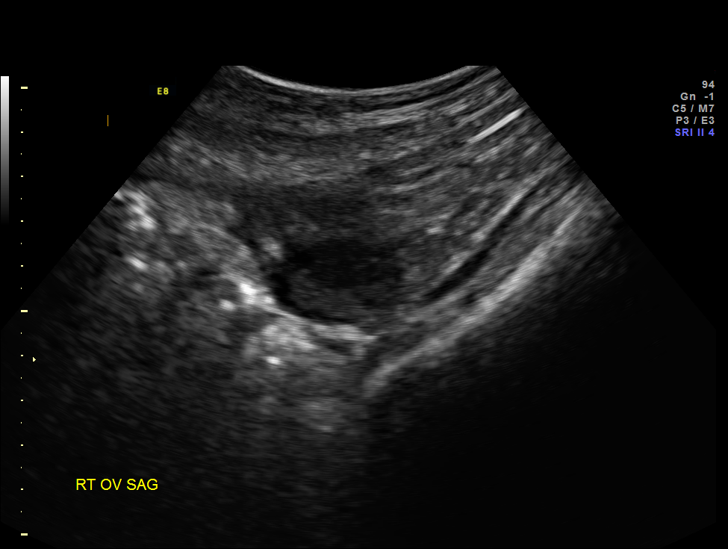
[im 16/52]
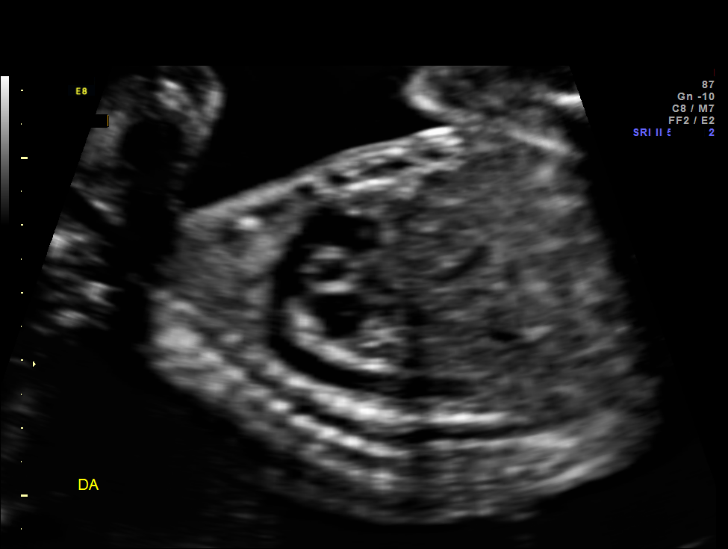
[im 19/52]
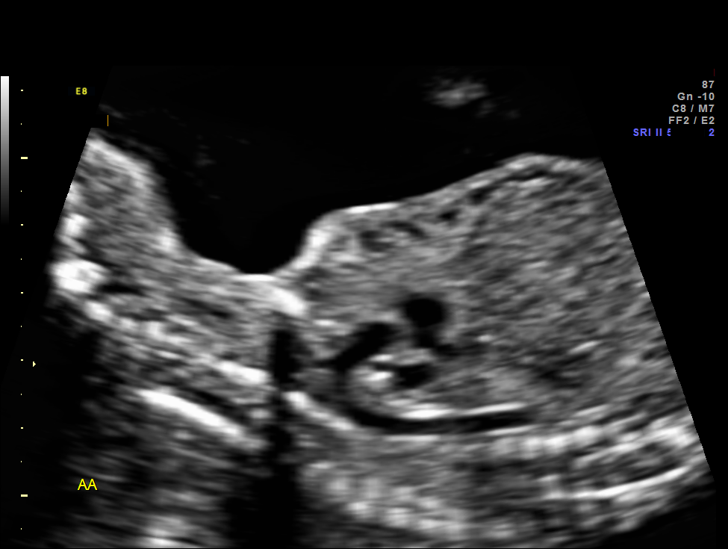
[im 23/52]
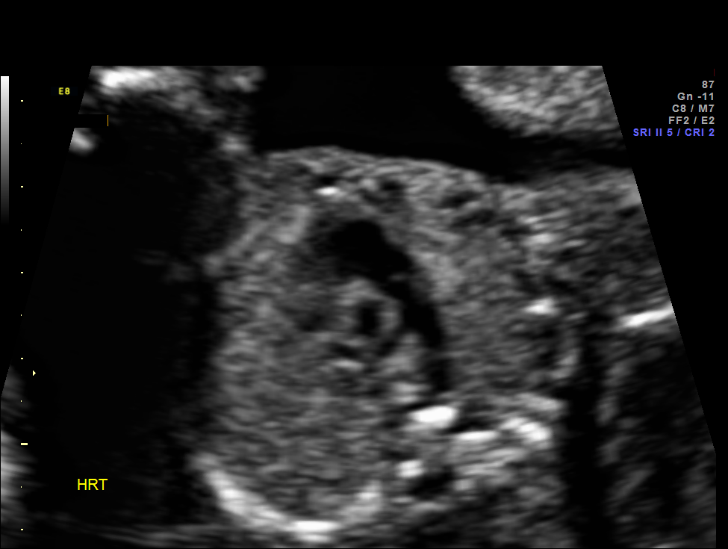
[im 29/52]
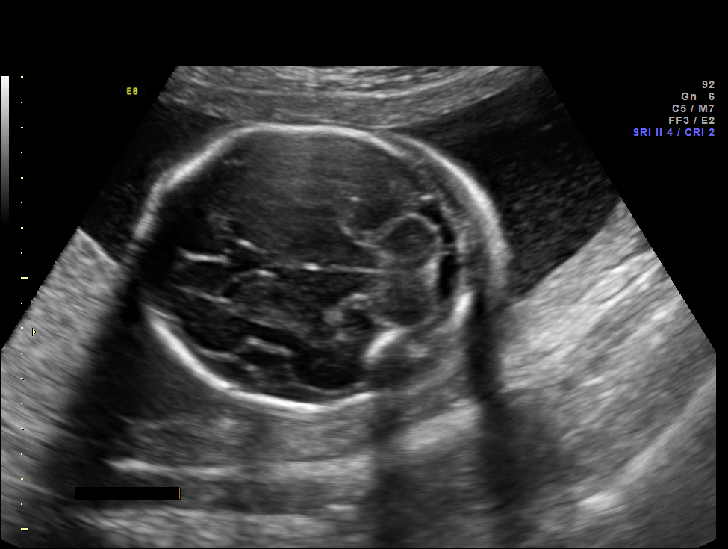
[im 33/52]
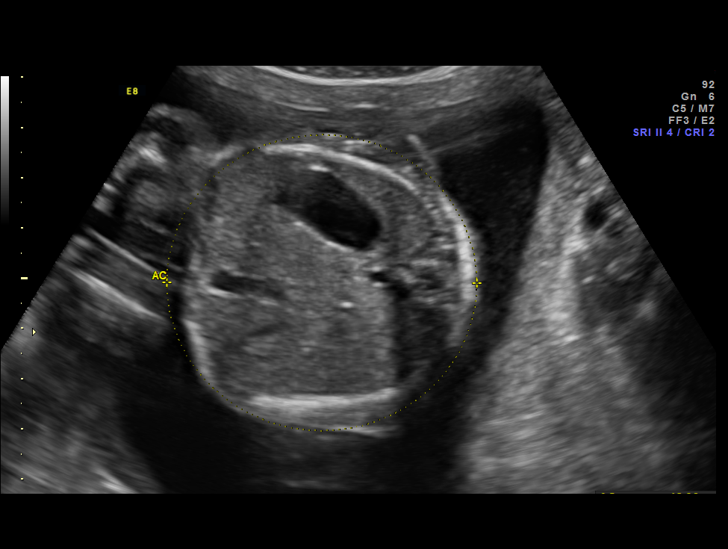
[im 36/52]
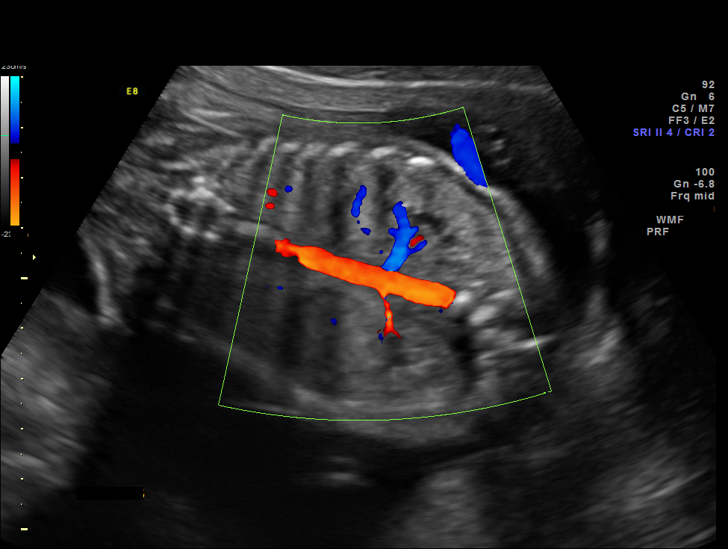
[im 42/52]
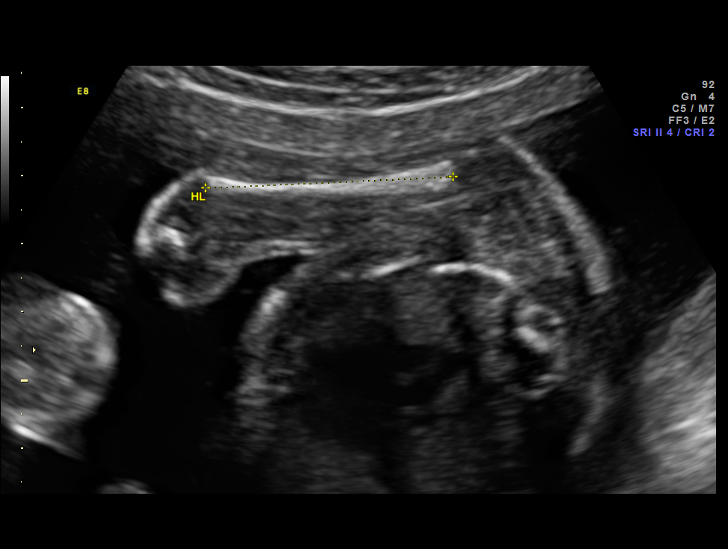
[im 46/52]
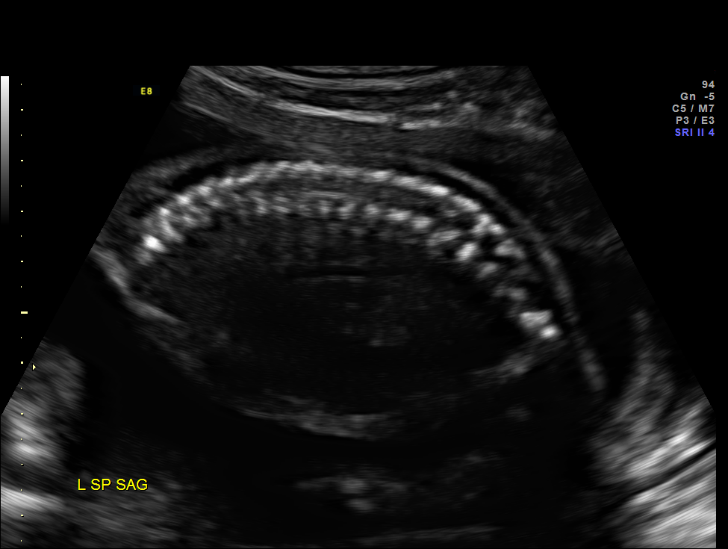
[im 50/52]
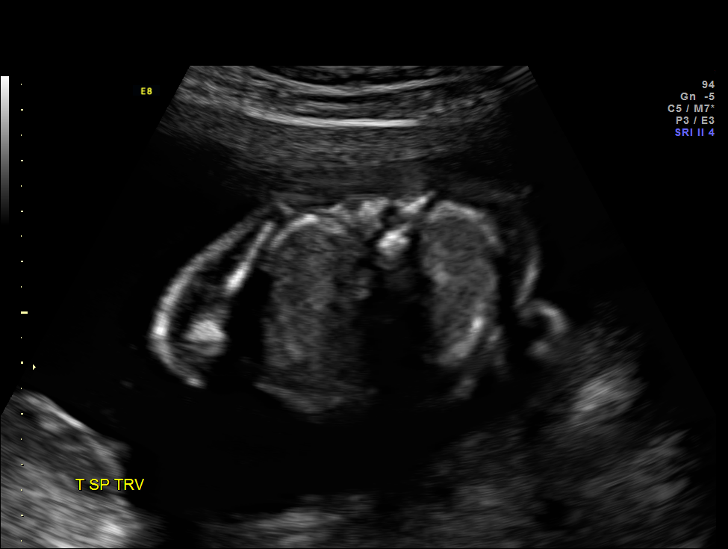

[12 of 28 positions shown; findings below may reference images not displayed]

OBSTETRICS REPORT
                      (Signed Final 07/29/2013 [DATE])

Service(s) Provided

 US OB FOLLOW UP                                       76816.1
Indications

 Follow-up incomplete fetal anatomic evaluation
 Chronic Kidney Disease  (maternal)
Fetal Evaluation

 Num Of Fetuses:    1
 Fetal Heart Rate:  144                          bpm
 Cardiac Activity:  Observed
 Presentation:      Breech
 Placenta:          Anterior, above cervical os
 P. Cord            Visualized, central
 Insertion:

 Amniotic Fluid
 AFI FV:      Subjectively within normal limits
                                             Larg Pckt:     7.3  cm
Biometry

 BPD:     54.8  mm     G. Age:  22w 5d                CI:         74.7   70 - 86
 OFD:     73.4  mm                                    FL/HC:      18.6   18.4 -

 HC:     206.1  mm     G. Age:  22w 5d       62  %    HC/AC:      1.08   1.06 -

 AC:     190.2  mm     G. Age:  23w 5d       87  %    FL/BPD:     70.1   71 - 87
 FL:      38.4  mm     G. Age:  22w 2d       45  %    FL/AC:      20.2   20 - 24
 HUM:     36.2  mm     G. Age:  22w 5d       59  %
 CER:     24.2  mm     G. Age:  22w 1d       51  %

 Est. FW:     559  gm      1 lb 4 oz     62  %
Gestational Age

 LMP:           22w 1d        Date:  02/24/13                 EDD:   12/01/13
 U/S Today:     22w 6d                                        EDD:   11/26/13
 Best:          22w 1d     Det. By:  LMP  (02/24/13)          EDD:   12/01/13
Anatomy
 Cranium:          Appears normal         Aortic Arch:      Appears normal
 Fetal Cavum:      Appears normal         Ductal Arch:      Appears normal
 Ventricles:       Appears normal         Diaphragm:        Previously seen
 Choroid Plexus:   Previously seen        Stomach:          Appears normal, left
                                                            sided
 Cerebellum:       Appears normal         Abdomen:          Appears normal
 Posterior Fossa:  Appears normal         Abdominal Wall:   Previously seen
 Nuchal Fold:      Previously seen        Cord Vessels:     Previously seen
 Face:             Orbits and profile     Kidneys:          Appear normal
                   previously seen
 Lips:             Previously seen        Bladder:          Appears normal
 Heart:            Appears normal         Spine:            Appears normal
                   (4CH, axis, and
                   situs)
 RVOT:             Appears normal         Lower             Previously seen
                                          Extremities:
 LVOT:             Appears normal         Upper             Previously seen
                                          Extremities:

 Other:  Fetus appears to be a male. Heels and 5th digit previously visualized.
         Nasal bone previously visualized.
Targeted Anatomy

 Fetal Central Nervous System
 Cisterna Magna:
Cervix Uterus Adnexa

 Cervical Length:    3.3      cm

 Cervix:       Normal appearance by transabdominal scan.
 Left Ovary:    Within normal limits.
 Right Ovary:   Within normal limits.

 Adnexa:     No abnormality visualized.
Impression

 Single IUP at 22 [DATE] weeks
 Maternal history of nonfunctioning kidney.
 Normal interval anatomy - the anatomic fetal survey is now
 complete
 Fetal growth is appropriate (62nd %tile)
 Normal amniotic fluid volume
Recommendations

 Follow-up ultrasounds as clinically indicated.

 questions or concerns.

## 2014-09-08 ENCOUNTER — Encounter: Payer: Self-pay | Admitting: Obstetrics & Gynecology

## 2014-11-03 ENCOUNTER — Encounter: Payer: Self-pay | Admitting: *Deleted

## 2014-11-04 ENCOUNTER — Encounter: Payer: Self-pay | Admitting: Obstetrics & Gynecology
